# Patient Record
Sex: Female | Born: 1960 | Race: White | Hispanic: No | Smoking: Former smoker
Health system: Southern US, Community
[De-identification: ages and names within clinical notes are randomized; demographics above are authoritative.]

## PROBLEM LIST (undated history)

## (undated) DIAGNOSIS — F419 Anxiety disorder, unspecified: Secondary | ICD-10-CM

## (undated) DIAGNOSIS — F32A Depression, unspecified: Secondary | ICD-10-CM

## (undated) DIAGNOSIS — I10 Essential (primary) hypertension: Secondary | ICD-10-CM

## (undated) DIAGNOSIS — F329 Major depressive disorder, single episode, unspecified: Secondary | ICD-10-CM

## (undated) DIAGNOSIS — E785 Hyperlipidemia, unspecified: Secondary | ICD-10-CM

## (undated) DIAGNOSIS — G8929 Other chronic pain: Secondary | ICD-10-CM

## (undated) DIAGNOSIS — M5030 Other cervical disc degeneration, unspecified cervical region: Secondary | ICD-10-CM

## (undated) DIAGNOSIS — D751 Secondary polycythemia: Secondary | ICD-10-CM

## (undated) DIAGNOSIS — E0500 Thyrotoxicosis with diffuse goiter without thyrotoxic crisis or storm: Secondary | ICD-10-CM

## (undated) HISTORY — DX: Other chronic pain: G89.29

## (undated) HISTORY — DX: Secondary polycythemia: D75.1

## (undated) HISTORY — DX: Essential (primary) hypertension: I10

## (undated) HISTORY — DX: Major depressive disorder, single episode, unspecified: F32.9

## (undated) HISTORY — DX: Depression, unspecified: F32.A

## (undated) HISTORY — DX: Hyperlipidemia, unspecified: E78.5

## (undated) HISTORY — DX: Other cervical disc degeneration, unspecified cervical region: M50.30

## (undated) HISTORY — DX: Thyrotoxicosis with diffuse goiter without thyrotoxic crisis or storm: E05.00

## (undated) HISTORY — DX: Anxiety disorder, unspecified: F41.9

---

## 2010-07-12 HISTORY — PX: REDUCTION MAMMAPLASTY: SUR839

## 2011-07-13 HISTORY — PX: OTHER SURGICAL HISTORY: SHX169

## 2013-09-28 ENCOUNTER — Ambulatory Visit: Payer: Self-pay | Admitting: Gastroenterology

## 2014-03-18 ENCOUNTER — Emergency Department: Payer: Self-pay | Admitting: Emergency Medicine

## 2015-04-28 ENCOUNTER — Encounter: Payer: Self-pay | Admitting: *Deleted

## 2015-04-28 ENCOUNTER — Ambulatory Visit (INDEPENDENT_AMBULATORY_CARE_PROVIDER_SITE_OTHER): Payer: BC Managed Care – PPO | Admitting: Obstetrics and Gynecology

## 2015-04-28 VITALS — BP 151/99 | HR 99 | Resp 16 | Ht 67.0 in | Wt 206.9 lb

## 2015-04-28 DIAGNOSIS — Z8639 Personal history of other endocrine, nutritional and metabolic disease: Secondary | ICD-10-CM | POA: Insufficient documentation

## 2015-04-28 DIAGNOSIS — F329 Major depressive disorder, single episode, unspecified: Secondary | ICD-10-CM | POA: Insufficient documentation

## 2015-04-28 DIAGNOSIS — R3129 Other microscopic hematuria: Secondary | ICD-10-CM | POA: Diagnosis not present

## 2015-04-28 DIAGNOSIS — M545 Low back pain, unspecified: Secondary | ICD-10-CM | POA: Insufficient documentation

## 2015-04-28 DIAGNOSIS — F419 Anxiety disorder, unspecified: Secondary | ICD-10-CM

## 2015-04-28 DIAGNOSIS — F1120 Opioid dependence, uncomplicated: Secondary | ICD-10-CM | POA: Insufficient documentation

## 2015-04-28 DIAGNOSIS — G8929 Other chronic pain: Secondary | ICD-10-CM | POA: Insufficient documentation

## 2015-04-28 DIAGNOSIS — I10 Essential (primary) hypertension: Secondary | ICD-10-CM | POA: Insufficient documentation

## 2015-04-28 DIAGNOSIS — M5136 Other intervertebral disc degeneration, lumbar region: Secondary | ICD-10-CM | POA: Insufficient documentation

## 2015-04-28 DIAGNOSIS — F32A Depression, unspecified: Secondary | ICD-10-CM | POA: Insufficient documentation

## 2015-04-28 LAB — URINALYSIS, COMPLETE
BILIRUBIN UA: NEGATIVE
GLUCOSE, UA: NEGATIVE
KETONES UA: NEGATIVE
Leukocytes, UA: NEGATIVE
NITRITE UA: NEGATIVE
PROTEIN UA: NEGATIVE
Specific Gravity, UA: 1.02 (ref 1.005–1.030)
UUROB: 0.2 mg/dL (ref 0.2–1.0)
pH, UA: 7 (ref 5.0–7.5)

## 2015-04-28 LAB — MICROSCOPIC EXAMINATION
Renal Epithel, UA: NONE SEEN /hpf
WBC UA: NONE SEEN /HPF (ref 0–?)

## 2015-04-28 NOTE — Progress Notes (Signed)
04/28/2015 9:09 AM   Madeline Zimmerman 06/08/1961 [id number]  Referring provider: No referring provider defined for this encounter.  Chief Complaint  Patient presents with  . Hematuria  . Establish Care    HPI: Patient is a 54 year old female presenting today as a referral from her primary care provider for microscopic hematuria on 2 separate occasions. She denies any urinary symptoms including urgency, frequency, dysuria, gross hematuria or sensation of incomplete bladder emptying. No flank pain or fevers.  No history of stones. No vaginal complaints. No family history of stones or GU malignancies.  Smoked 10 years < 1ppd. Quit 20 years ago. 4-10 RBCs seen on 09/26/14 and 04/15/15 Cr 0.8 04/15/15  PMH: Past Medical History  Diagnosis Date  . Anxiety and depression   . HTN (hypertension)   . Chronic pain     With narcotic dependence  . DDD (degenerative disc disease), cervical   . Graves disease     Surgical History: Past Surgical History  Procedure Laterality Date  . Reduction mammaplasty  2012  . Revision breast reduction  2013    Home Medications:    Medication List       This list is accurate as of: 04/28/15  9:09 AM.  Always use your most recent med list.               ADVIL 200 MG tablet  Generic drug:  ibuprofen  Take 200 mg by mouth.     estradiol 0.1 MG/24HR patch  Commonly known as:  VIVELLE-DOT  Place onto the skin.     fluticasone 50 MCG/ACT nasal spray  Commonly known as:  FLONASE  Place into the nose.     LYRICA 75 MG capsule  Generic drug:  pregabalin  Take 75 mg by mouth 3 (three) times daily.     meloxicam 7.5 MG tablet  Commonly known as:  MOBIC  TAKE 1 TABLET BY MOUTH EVERY DAY     norethindrone 5 MG tablet  Commonly known as:  AYGESTIN  Take by mouth.     PARoxetine 20 MG tablet  Commonly known as:  PAXIL  TAKE 1 TABLET (20 MG TOTAL) BY MOUTH ONCE DAILY.     traMADol 50 MG tablet  Commonly known as:  ULTRAM  Take by  mouth.        Allergies:  Allergies  Allergen Reactions  . Bacitracin Itching and Rash  . Neosporin  [Neomycin-Bacitracin Zn-Polymyx] Rash    Family History: Family History  Problem Relation Age of Onset  . Heart failure Father   . Hypertension Mother     Social History:  reports that she has quit smoking. She does not have any smokeless tobacco history on file. She reports that she does not drink alcohol or use illicit drugs.  ROS: UROLOGY Frequent Urination?: No Hard to postpone urination?: No Burning/pain with urination?: No Get up at night to urinate?: No Leakage of urine?: No Urine stream starts and stops?: No Trouble starting stream?: No Do you have to strain to urinate?: No Blood in urine?: Yes Urinary tract infection?: No Sexually transmitted disease?: No Injury to kidneys or bladder?: No Painful intercourse?: No Weak stream?: No Currently pregnant?: No Vaginal bleeding?: No Last menstrual period?: n/a  Gastrointestinal Nausea?: No Vomiting?: No Indigestion/heartburn?: No Diarrhea?: No Constipation?: No  Constitutional Fever: No Night sweats?: No Weight loss?: No Fatigue?: No  Skin Skin rash/lesions?: No Itching?: No  Eyes Blurred vision?: No Double vision?: No  Ears/Nose/Throat Sore  throat?: No Sinus problems?: No  Hematologic/Lymphatic Swollen glands?: No Easy bruising?: No  Cardiovascular Leg swelling?: No Chest pain?: No  Respiratory Cough?: No Shortness of breath?: No  Endocrine Excessive thirst?: No  Musculoskeletal Back pain?: Yes Joint pain?: No  Neurological Headaches?: No Dizziness?: No  Psychologic Depression?: Yes Anxiety?: Yes  Physical Exam: BP 151/99 mmHg  Pulse 99  Resp 16  Ht  (1.702 m)  Wt 206 lb 14.4 oz (93.849 kg)  BMI 32.40 kg/m2  Constitutional:  Alert and oriented, No acute distress. HEENT: [place] AT, moist mucus membranes.  Trachea midline, no masses. Cardiovascular: No clubbing,  cyanosis, or edema. Respiratory: Normal respiratory effort, no increased work of breathing. GI: Abdomen is soft, nontender, nondistended, no abdominal masses GU: No CVA tenderness.  Skin: No rashes, bruises or suspicious lesions. Lymph: No cervical or inguinal adenopathy. Neurologic: Grossly intact, no focal deficits, moving all 4 extremities. Psychiatric: Normal mood and affect.  Laboratory Data: No results found for: WBC, HGB, HCT, MCV, PLT  No results found for: CREATININE  No results found for: PSA  No results found for: TESTOSTERONE  No results found for: HGBA1C  Urinalysis No results found for: COLORURINE, APPEARANCEUR, LABSPEC, PHURINE, GLUCOSEU, HGBUR, BILIRUBINUR, KETONESUR, PROTEINUR, UROBILINOGEN, NITRITE, LEUKOCYTESUR  Pertinent Imaging:   Assessment & Plan: 54 year old female with a remote smoking history presenting as a referral for microscopic hematuria.  1. Microscopic hematuria-  0-2 RBCs seen today.We discussed the differential diagnosis for microscopic hematuria including nephrolithiasis, renal or upper tract tumors, bladder stones, UTIs, or bladder tumors as well as undetermined etiologies.Per AUA guidelines, I did recommend complete microscopic hematuria evaluation including CTU, possible urine cytology, and office cystoscopy. He states understanding and is in agreement with plan.  There are no diagnoses linked to this encounter.  Return for CT Urogram results; cystoscopy.  Earlie Lou, FNP  [facility] [address] [place], [place] [address] [phone]

## 2015-05-05 ENCOUNTER — Other Ambulatory Visit: Payer: Self-pay | Admitting: Obstetrics and Gynecology

## 2015-05-05 DIAGNOSIS — R3129 Other microscopic hematuria: Secondary | ICD-10-CM

## 2015-05-09 ENCOUNTER — Ambulatory Visit
Admission: RE | Admit: 2015-05-09 | Discharge: 2015-05-09 | Disposition: A | Discharge status: Home / Self Care | Payer: BC Managed Care – PPO | Source: Ambulatory Visit | Attending: Obstetrics and Gynecology | Admitting: Obstetrics and Gynecology

## 2015-05-09 DIAGNOSIS — R3129 Other microscopic hematuria: Secondary | ICD-10-CM | POA: Diagnosis present

## 2015-05-09 MED ORDER — IOHEXOL 300 MG/ML  SOLN
125.0000 mL | Freq: Once | INTRAMUSCULAR | Status: AC | PRN
  Administered 2015-05-09: 125 mL via INTRAVENOUS

## 2015-05-13 ENCOUNTER — Other Ambulatory Visit: Payer: Self-pay | Admitting: Urology

## 2015-05-28 ENCOUNTER — Telehealth: Payer: Self-pay

## 2015-05-28 NOTE — Telephone Encounter (Signed)
Spoke with pt who stated her mother had a heart attack and was in the hospital. 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

## 2015-05-28 NOTE — Telephone Encounter (Signed)
-----   Message from Fernanda DrumLindsay C Overton, FNP sent at 05/26/2015 11:23 AM EST ----- We please follow up see why this patient did not return for her CT results and her cystoscopy for completion of her hematuria workup please. Thank you

## 2015-06-12 ENCOUNTER — Inpatient Hospital Stay: Payer: BC Managed Care – PPO

## 2015-06-12 ENCOUNTER — Inpatient Hospital Stay: Payer: BC Managed Care – PPO | Attending: Oncology | Admitting: Oncology

## 2015-06-12 VITALS — BP 132/93 | HR 91 | Temp 97.8°F | Resp 18 | Wt 208.1 lb

## 2015-06-12 DIAGNOSIS — Z87891 Personal history of nicotine dependence: Secondary | ICD-10-CM | POA: Diagnosis not present

## 2015-06-12 DIAGNOSIS — F1120 Opioid dependence, uncomplicated: Secondary | ICD-10-CM | POA: Diagnosis not present

## 2015-06-12 DIAGNOSIS — E0500 Thyrotoxicosis with diffuse goiter without thyrotoxic crisis or storm: Secondary | ICD-10-CM | POA: Insufficient documentation

## 2015-06-12 DIAGNOSIS — M5030 Other cervical disc degeneration, unspecified cervical region: Secondary | ICD-10-CM | POA: Insufficient documentation

## 2015-06-12 DIAGNOSIS — D751 Secondary polycythemia: Secondary | ICD-10-CM

## 2015-06-12 DIAGNOSIS — F419 Anxiety disorder, unspecified: Secondary | ICD-10-CM | POA: Insufficient documentation

## 2015-06-12 DIAGNOSIS — G8929 Other chronic pain: Secondary | ICD-10-CM | POA: Insufficient documentation

## 2015-06-12 DIAGNOSIS — I10 Essential (primary) hypertension: Secondary | ICD-10-CM | POA: Insufficient documentation

## 2015-06-12 DIAGNOSIS — F329 Major depressive disorder, single episode, unspecified: Secondary | ICD-10-CM | POA: Insufficient documentation

## 2015-06-12 DIAGNOSIS — Z79899 Other long term (current) drug therapy: Secondary | ICD-10-CM | POA: Diagnosis not present

## 2015-06-12 LAB — IRON AND TIBC
Iron: 87 ug/dL (ref 28–170)
SATURATION RATIOS: 24 % (ref 10.4–31.8)
TIBC: 368 ug/dL (ref 250–450)
UIBC: 281 ug/dL

## 2015-06-12 LAB — CBC
HCT: 49 % — ABNORMAL HIGH (ref 35.0–47.0)
HEMOGLOBIN: 16.2 g/dL — AB (ref 12.0–16.0)
MCH: 29.8 pg (ref 26.0–34.0)
MCHC: 33.1 g/dL (ref 32.0–36.0)
MCV: 90 fL (ref 80.0–100.0)
Platelets: 385 10*3/uL (ref 150–440)
RBC: 5.44 MIL/uL — AB (ref 3.80–5.20)
RDW: 13.3 % (ref 11.5–14.5)
WBC: 8.1 10*3/uL (ref 3.6–11.0)

## 2015-06-12 LAB — FERRITIN: FERRITIN: 56 ng/mL (ref 11–307)

## 2015-06-12 NOTE — Progress Notes (Signed)
Patient denies any problems today. 

## 2015-06-13 LAB — CARBON MONOXIDE, BLOOD (PERFORMED AT REF LAB): Carbon Monoxide, Blood: 2.2 % — ABNORMAL HIGH (ref 0.0–1.9)

## 2015-06-18 LAB — ERYTHROPOIETIN: ERYTHROPOIETIN: 5.1 m[IU]/mL (ref 2.6–18.5)

## 2015-06-18 LAB — JAK2 GENOTYPR

## 2015-06-18 LAB — HEMOCHROMATOSIS DNA-PCR(C282Y,H63D)

## 2015-06-20 NOTE — Progress Notes (Signed)
[facility]  [phone] [phone]) [phone]  ID: Madeline MouldAmy C Zimmerman OB: 11/23/1960  MR#: [id number]  ION#:[id number]#:[id number]  Patient Care Team: Marguarite ArbourJeffrey D Sparks, MD as PCP - General (Internal Medicine)  CHIEF COMPLAINT:  Chief Complaint  Patient presents with  . New Evaluation    polycythemia    INTERVAL HISTORY: Patient is a 54 year old female who was found to have a mildly elevated hemoglobin on routine blood work. She currently feels well and is asymptomatic. She has no neurologic complaints. She denies any recent fevers or illnesses. She has a good appetite and denies weight loss. She denies any chest pain or shortness of breath. She denies any nausea, vomiting, constipation, or diarrhea. She has no urinary complaints. Patient feels at her baseline and offers no specific complaints today.  REVIEW OF SYSTEMS:   Review of Systems  Constitutional: Negative.  Negative for malaise/fatigue.  Respiratory: Negative.   Cardiovascular: Negative.   Gastrointestinal: Negative.   Musculoskeletal: Negative.   Neurological: Negative.  Negative for weakness.    As per HPI. Otherwise, a complete review of systems is negatve.  PAST MEDICAL HISTORY: Past Medical History  Diagnosis Date  . Anxiety and depression   . HTN (hypertension)   . Chronic pain     With narcotic dependence  . DDD (degenerative disc disease), cervical   . Graves disease     PAST SURGICAL HISTORY: Past Surgical History  Procedure Laterality Date  . Reduction mammaplasty  2012  . Revision breast reduction  2013    FAMILY HISTORY Family History  Problem Relation Age of Onset  . Heart failure Father   . Hypertension Mother        ADVANCED DIRECTIVES:    HEALTH MAINTENANCE: Social History  Substance Use Topics  . Smoking status: Former Games developermoker  . Smokeless tobacco: Not on file  . Alcohol Use: No     Colonoscopy:  PAP:  Bone density:  Lipid panel:  Allergies  Allergen Reactions    . Bacitracin Itching and Rash  . Neosporin  [Neomycin-Bacitracin Zn-Polymyx] Rash    Current Outpatient Prescriptions  Medication Sig Dispense Refill  . estradiol (VIVELLE-DOT) 0.1 MG/24HR patch Place onto the skin.    . fluticasone (FLONASE) 50 MCG/ACT nasal spray Place into the nose.    . ibuprofen (ADVIL) 200 MG tablet Take 200 mg by mouth.    Marland Kitchen. LORazepam (ATIVAN) 0.5 MG tablet TAKE 1 TABLET BY MOUTH 3 TIMES DAILY AS NEEDED FOR ANXIETY    . LYRICA 75 MG capsule Take 75 mg by mouth 3 (three) times daily.  5  . meloxicam (MOBIC) 7.5 MG tablet TAKE 1 TABLET BY MOUTH EVERY DAY    . norethindrone (AYGESTIN) 5 MG tablet Take by mouth.    Marland Kitchen. PARoxetine (PAXIL) 20 MG tablet TAKE 1 TABLET (20 MG TOTAL) BY MOUTH ONCE DAILY.    . traMADol (ULTRAM) 50 MG tablet Take by mouth.     No current facility-administered medications for this visit.    OBJECTIVE: Filed Vitals:   06/12/15 1021  BP: 132/93  Pulse: 91  Temp: 97.8 F (36.6 C)  Resp: 18     Body mass index is 32.59 kg/(m^2).    ECOG FS:0 - Asymptomatic  General: Well-developed, well-nourished, no acute distress. Eyes: Pink conjunctiva, anicteric sclera. HEENT: Normocephalic, moist mucous membranes, clear oropharnyx. Lungs: Clear to auscultation bilaterally. Heart: Regular rate and rhythm. No rubs, murmurs, or gallops. Abdomen: Soft, nontender, nondistended. No organomegaly noted, normoactive bowel sounds. Musculoskeletal:  No edema, cyanosis, or clubbing. Neuro: Alert, answering all questions appropriately. Cranial nerves grossly intact. Skin: No rashes or petechiae noted. Psych: Normal affect. Lymphatics: No cervical, calvicular, axillary or inguinal LAD.   LAB RESULTS:  No results found for: NA, K, CL, CO2, GLUCOSE, BUN, CREATININE, CALCIUM, PROT, ALBUMIN, AST, ALT, ALKPHOS, BILITOT, GFRNONAA, GFRAA  Lab Results  Component Value Date   WBC 8.1 06/12/2015   HGB 16.2* 06/12/2015   HCT 49.0* 06/12/2015   MCV 90.0 06/12/2015    PLT 385 06/12/2015     STUDIES: No results found.  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  PLAN:    1. Polycythemia: Patient's hemoglobin is only mildly elevated at 16.2. The remainder of her laboratory work including iron stores, erythropoietin, and JAK-2 mutation are either negative or within normal limits. She has a mildly elevated carboxyhemoglobin level that is likely clinically insignificant. No intervention is needed at this time. Patient does not require phlebotomy. After lengthy discussion with the patient, she has requested no further follow-up. Please refer her back if her hemoglobin trends up and approaches 18.0. 2. Heterozygote for hemachromatosis: Patient is an asymptomatic carrier. Iron stores, including ferritin, are within normal limits. No intervention is needed.  Patient expressed understanding and was in agreement with this plan. She also understands that She can call clinic at any time with any questions, concerns, or complaints.    Jeralyn Ruths, MD   06/20/2015 5:32 PM

## 2016-03-04 ENCOUNTER — Other Ambulatory Visit: Payer: Self-pay | Admitting: Internal Medicine

## 2016-03-04 DIAGNOSIS — M5136 Other intervertebral disc degeneration, lumbar region: Secondary | ICD-10-CM

## 2016-03-23 ENCOUNTER — Ambulatory Visit
Admission: RE | Admit: 2016-03-23 | Discharge: 2016-03-23 | Disposition: A | Discharge status: Home / Self Care | Payer: BC Managed Care – PPO | Source: Ambulatory Visit | Attending: Internal Medicine | Admitting: Internal Medicine

## 2016-03-23 DIAGNOSIS — M2578 Osteophyte, vertebrae: Secondary | ICD-10-CM | POA: Diagnosis not present

## 2016-03-23 DIAGNOSIS — M5136 Other intervertebral disc degeneration, lumbar region: Secondary | ICD-10-CM | POA: Insufficient documentation

## 2016-03-23 DIAGNOSIS — M5126 Other intervertebral disc displacement, lumbar region: Secondary | ICD-10-CM | POA: Diagnosis not present

## 2016-03-23 DIAGNOSIS — M4806 Spinal stenosis, lumbar region: Secondary | ICD-10-CM | POA: Insufficient documentation

## 2017-05-05 ENCOUNTER — Ambulatory Visit
Payer: BC Managed Care – PPO | Attending: Student in an Organized Health Care Education/Training Program | Admitting: Student in an Organized Health Care Education/Training Program

## 2017-05-05 ENCOUNTER — Encounter: Payer: Self-pay | Admitting: Student in an Organized Health Care Education/Training Program

## 2017-05-05 VITALS — BP 140/97 | HR 66 | Temp 98.2°F | Resp 16 | Ht 68.0 in | Wt 205.0 lb

## 2017-05-05 DIAGNOSIS — Z791 Long term (current) use of non-steroidal anti-inflammatories (NSAID): Secondary | ICD-10-CM | POA: Insufficient documentation

## 2017-05-05 DIAGNOSIS — M47816 Spondylosis without myelopathy or radiculopathy, lumbar region: Secondary | ICD-10-CM | POA: Diagnosis not present

## 2017-05-05 DIAGNOSIS — G894 Chronic pain syndrome: Secondary | ICD-10-CM

## 2017-05-05 DIAGNOSIS — E0500 Thyrotoxicosis with diffuse goiter without thyrotoxic crisis or storm: Secondary | ICD-10-CM | POA: Diagnosis not present

## 2017-05-05 DIAGNOSIS — F329 Major depressive disorder, single episode, unspecified: Secondary | ICD-10-CM

## 2017-05-05 DIAGNOSIS — Z79891 Long term (current) use of opiate analgesic: Secondary | ICD-10-CM | POA: Insufficient documentation

## 2017-05-05 DIAGNOSIS — M9983 Other biomechanical lesions of lumbar region: Secondary | ICD-10-CM | POA: Diagnosis not present

## 2017-05-05 DIAGNOSIS — Z881 Allergy status to other antibiotic agents status: Secondary | ICD-10-CM | POA: Diagnosis not present

## 2017-05-05 DIAGNOSIS — I129 Hypertensive chronic kidney disease with stage 1 through stage 4 chronic kidney disease, or unspecified chronic kidney disease: Secondary | ICD-10-CM | POA: Insufficient documentation

## 2017-05-05 DIAGNOSIS — F419 Anxiety disorder, unspecified: Secondary | ICD-10-CM

## 2017-05-05 DIAGNOSIS — N189 Chronic kidney disease, unspecified: Secondary | ICD-10-CM | POA: Diagnosis not present

## 2017-05-05 DIAGNOSIS — Z79899 Other long term (current) drug therapy: Secondary | ICD-10-CM | POA: Insufficient documentation

## 2017-05-05 DIAGNOSIS — M48061 Spinal stenosis, lumbar region without neurogenic claudication: Secondary | ICD-10-CM

## 2017-05-05 DIAGNOSIS — Z888 Allergy status to other drugs, medicaments and biological substances status: Secondary | ICD-10-CM | POA: Insufficient documentation

## 2017-05-05 NOTE — Patient Instructions (Addendum)
Today we discussed the following:  1. Please wean yourself off of Ativan. You will not be considered for opioid therapy at this clinic if you are on benzodiazepines.  2. Referral to pain psychology regarding risk of chronic opioid therapy.  3. Lumbar epidural steroid injection in January after patient's insurance resets. Can also consider lumbar facets for lumbar spondylosis.   4. Follow-up in 2 months or earlier once you have weaned herself off Ativan and once you have seen the pain psychologist.  Epidural Steroid Injection Patient Information  Description: The epidural space surrounds the nerves as they exit the spinal cord.  In some patients, the nerves can be compressed and inflamed by a bulging disc or a tight spinal canal (spinal stenosis).  By injecting steroids into the epidural space, we can bring irritated nerves into direct contact with a potentially helpful medication.  These steroids act directly on the irritated nerves and can reduce swelling and inflammation which often leads to decreased pain.  Epidural steroids may be injected anywhere along the spine and from the neck to the low back depending upon the location of your pain.   After numbing the skin with local anesthetic (like Novocaine), a small needle is passed into the epidural space slowly.  You may experience a sensation of pressure while this is being done.  The entire block usually last less than 10 minutes.  Conditions which may be treated by epidural steroids:   Low back and leg pain  Neck and arm pain  Spinal stenosis  Post-laminectomy syndrome  Herpes zoster (shingles) pain  Pain from compression fractures  Preparation for the injection:  1. Do not eat any solid food or dairy products within 3 hours of your appointment.  2. You may drink clear liquids up to 3 hours before appointment.  Clear liquids include water, black coffee, juice or soda.  No milk or cream please. 3. You may take your regular  medication, including pain medications, with a sip of water before your appointment  Diabetics should hold regular insulin (if taken separately) and take 1/2 normal NPH dos the morning of the procedure.  Carry some sugar containing items with you to your appointment. 4. Bring all your current medications with your. 5. An IV may be inserted and sedation may be given at the discretion of the physician.   6. A blood pressure cuff, EKG and other monitors will often be applied during the procedure.  Some patients may need to have extra oxygen administered for a short period. 7. You will be asked to provide medical information, including your allergies, prior to the procedure.  We must know immediately if you are taking blood thinners (like Coumadin/Warfarin)  Or if you are allergic to IV iodine contrast (dye). We must know if you could possible be pregnant.  Possible side-effects:  Bleeding from needle site  Infection (rare, may require surgery)  Nerve injury (rare)  Numbness & tingling (temporary)  Difficulty urinating (rare, temporary)  Spinal headache ( a headache worse with upright posture)  Light -headedness (temporary)  Pain at injection site (several days)  Decreased blood pressure (temporary)  Weakness in arm/leg (temporary)  Pressure sensation in back/neck (temporary)  Call if you experience:  Fever/chills associated with headache or increased back/neck pain.  Headache worsened by an upright position.  New onset weakness or numbness of an extremity below the injection site  Hives or difficulty breathing (go to the emergency room)  Inflammation or drainage at the infection site  Severe back/neck pain  Any new symptoms which are concerning to you  Please note:  Although the local anesthetic injected can often make your back or neck feel good for several hours after the injection, the pain will likely return.  It takes 3-7 days for steroids to work in the epidural  space.  You may not notice any pain relief for at least that one week.  If effective, we will often do a series of three injections spaced 3-6 weeks apart to maximally decrease your pain.  After the initial series, we generally will wait several months before considering a repeat injection of the same type.  If you have any questions, please call [phone] [facility]

## 2017-05-05 NOTE — Progress Notes (Signed)
Safety precautions to be maintained throughout the outpatient stay will include: orient to surroundings, keep bed in low position, maintain call bell within reach at all times, provide assistance with transfer out of bed and ambulation.  

## 2017-05-05 NOTE — Progress Notes (Signed)
Patient's Name: DARCIE MELLONE  MRN: [id number]  Referring Provider: Idelle Crouch, MD  DOB: 05-Nov-1960  PCP: Idelle Crouch, MD  DOS: 05/05/2017  Note by: Gillis Santa, MD  Service setting: Ambulatory outpatient  Specialty: Interventional Pain Management  Location: [facility] (AMB) Pain Management Facility  Visit type: Initial Patient Evaluation  Patient type: New Patient   Primary Reason(s) for Visit: Encounter for initial evaluation of one or more chronic problems (new to examiner) potentially causing chronic pain, and posing a threat to normal musculoskeletal function. (Level of risk: High) CC: Back Pain (lower)  HPI  Ms. Lapage is a 56 y.o. year old, female patient, who comes today to see [place] for the first time for an initial evaluation of her chronic pain. She has Anxiety and depression; Benign hypertension; Chronic pain; Degeneration of intervertebral disc of lumbar region; Personal history of other endocrine, nutritional and metabolic disease; and Opioid dependence ([place]) on her problem list. Today she comes in for evaluation of her Back Pain (lower)  Pain Assessment: Location: Lower Back Radiating: left buttock and entire left leg Onset: More than a month ago Duration: Chronic pain Quality: Sharp, Aching, Stabbing, Dull Severity: 3 /10 (self-reported pain score)  Note: Reported level is compatible with observation.                   When using our objective Pain Scale, levels between 6 and 10/10 are said to belong in an emergency room, as it progressively worsens from a 6/10, described as severely limiting, requiring emergency care not usually available at an outpatient pain management facility. At a 6/10 level, communication becomes difficult and requires great effort. Assistance to reach the emergency department may be required. Facial flushing and profuse sweating along with potentially dangerous increases in heart rate and blood pressure will be evident. Effect on ADL: limits  housework Timing: Intermittent Modifying factors: ice, heat, lying down  Onset and Duration: Gradual and Present longer than 3 months Cause of pain: "chiropractor said it started when I was pregnant" Severity: Getting worse, NAS-11 at its worse: 10/10, NAS-11 at its best: 0/10, NAS-11 now: 2/10 and NAS-11 on the average: 4/10 Timing: During activity or exercise Aggravating Factors: Bending, Lifiting, Prolonged sitting, Prolonged standing, Stooping , Twisting and Walking Alleviating Factors: Lying down, Medications, Resting and Chiropractic manipulations Associated Problems: Fatigue and Pain that wakes patient up Quality of Pain: Aching, Annoying, Burning, Deep, Disabling, Sharp, Shooting, Stabbing and Tiring Previous Examinations or Tests: CT scan, MRI scan and Chiropractic evaluation Previous Treatments: Narcotic medications and Pool exercises  The patient comes into the clinics today for the first time for a chronic pain management evaluation.   56 year old female who presents with axial low back pain left greater than right with radiation into her left leg. Pain has been going on for many years. Patient states that it started after childbirth. She has tried various medications including Lyrica, gabapentin and she did not find effective. She has tried various muscle relaxants including Flexeril and did not like how they made her feel. She has also tried various gels and topical therapies including Voltaren and lidocaine ointment which were not effective. Patient states that she does do morning stretching exercises to help with her pain. Patient also has depression for which she is on Paxil 20 mg.  Of note patient's chronic pain was previously managed by Dr. Doy Hutching, PCP. However in May 2018, patient's urine drug screen was positive for marijuana in her opioid medications were weaned  and discontinued. Prior to this patient was on concomitant opioid and benzodiazepine therapy with Ativan 0.5 mg 3  times a day when necessary. She states that her pain and quality of life are worse now without her being on opioid medications.  No red flags. No bowel or bladder dysfunction. No lower extremities weakness.  Today I took the time to provide the patient with information regarding my pain practice. The patient was informed that my practice is divided into two sections: an interventional pain management section, as well as a completely separate and distinct medication management section. I explained that I have procedure days for my interventional therapies, and evaluation days for follow-ups and medication management. Because of the amount of documentation required during both, they are kept separated. This means that there is the possibility that she may be scheduled for a procedure on one day, and medication management the next. I have also informed her that because of staffing and facility limitations, I no longer take patients for medication management only. To illustrate the reasons for this, I gave the patient the example of surgeons, and how inappropriate it would be to refer a patient to his/her care, just to write for the post-surgical antibiotics on a surgery done by a different surgeon.   Because interventional pain management is my board-certified specialty, the patient was informed that joining my practice means that they are open to any and all interventional therapies. I made it clear that this does not mean that they will be forced to have any procedures done. What this means is that I believe interventional therapies to be essential part of the diagnosis and proper management of chronic pain conditions. Therefore, patients not interested in these interventional alternatives will be better served under the care of a different practitioner.  The patient was also made aware of my Comprehensive Pain Management Safety Guidelines where by joining my practice, they limit all of their nerve blocks and  joint injections to those done by our practice, for as long as we are retained to manage their care.   Historic Controlled Substance Pharmacotherapy Review  PMP and historical list of controlled substances: Previously on hydrocodone 10 mg 3 times a day to 4 times a day, quantity 120 month. MME/day: 40 mg/day Medications: The patient did not bring the medication(s) to the appointment, as requested in our "New Patient Package" Pharmacodynamics: Desired effects: Analgesia: The patient reports >50% benefit. Reported improvement in function: The patient reports medication allows her to accomplish basic ADLs. Clinically meaningful improvement in function (CMIF): Sustained CMIF goals met Perceived effectiveness: Described as relatively effective, allowing for increase in activities of daily living (ADL) Undesirable effects: Side-effects or Adverse reactions: None reported Historical Monitoring: The patient  reports that she does not use drugs. List of all UDS Test(s): No results found for: MDMA, COCAINSCRNUR, PCPSCRNUR, PCPQUANT, CANNABQUANT, THCU, Ivor List of all Serum Drug Screening Test(s):  No results found for: AMPHSCRSER, BARBSCRSER, BENZOSCRSER, COCAINSCRSER, PCPSCRSER, PCPQUANT, THCSCRSER, CANNABQUANT, OPIATESCRSER, OXYSCRSER, PROPOXSCRSER Historical Background Evaluation: [place] PMP: Six (6) year initial data search conducted.             PMP NARX Score Report:  Narcotic: 370 Sedative: 390 Stimulant: 0 [place] Department of public safety, offender search: Editor, commissioning Information) Non-contributory Risk Assessment Profile: Aberrant behavior: None observed or detected today Risk factors for fatal opioid overdose: None identified today PMP NARX Overdose Risk Score: 130 Fatal overdose hazard ratio (HR): Calculation deferred Non-fatal overdose hazard ratio (HR): Calculation deferred Risk of  opioid abuse or dependence: 0.7-3.0% with doses ? 36 MME/day and 6.1-26% with doses ? 120 MME/day. Substance  use disorder (SUD) risk level: Pending results of Medical Psychology Evaluation for SUD Opioid risk tool (ORT) (Total Score): 0     Opioid Risk Tool - 05/05/17 0817      Family History of Substance Abuse   Alcohol Negative   Illegal Drugs Negative   Rx Drugs Negative     Personal History of Substance Abuse   Alcohol Negative   Illegal Drugs Negative   Rx Drugs Negative     Age   Age between 73-45 years  No     History of Preadolescent Sexual Abuse   History of Preadolescent Sexual Abuse Negative or Female     Psychological Disease   Psychological Disease Negative   Depression Negative     Total Score   Opioid Risk Tool Scoring 0   Opioid Risk Interpretation Low Risk     ORT Scoring interpretation table:  Score <3 = Low Risk for SUD  Score between 4-7 = Moderate Risk for SUD  Score >8 = High Risk for Opioid Abuse     Pharmacologic Plan: Pending ordered tests and/or consults            Initial impression: Pending review of available data and ordered tests.  Meds   Current Outpatient Prescriptions:  .  bisoprolol-hydrochlorothiazide (ZIAC) 5-6.25 MG tablet, Take 1 tablet by mouth daily., Disp: , Rfl:  .  fluticasone (FLONASE) 50 MCG/ACT nasal spray, Place into the nose., Disp: , Rfl:  .  ibuprofen (ADVIL) 200 MG tablet, Take 200 mg by mouth., Disp: , Rfl:  .  LORazepam (ATIVAN) 0.5 MG tablet, TAKE 1 TABLET BY MOUTH 3 TIMES DAILY AS NEEDED FOR ANXIETY, Disp: , Rfl:  .  LYRICA 75 MG capsule, Take 75 mg by mouth 3 (three) times daily., Disp: , Rfl: 5 .  PARoxetine (PAXIL) 20 MG tablet, TAKE 1 TABLET (20 MG TOTAL) BY MOUTH ONCE DAILY., Disp: , Rfl:  .  estradiol (VIVELLE-DOT) 0.1 MG/24HR patch, Place onto the skin., Disp: , Rfl:  .  meloxicam (MOBIC) 7.5 MG tablet, TAKE 1 TABLET BY MOUTH EVERY DAY, Disp: , Rfl:  .  norethindrone (AYGESTIN) 5 MG tablet, Take by mouth., Disp: , Rfl:  .  traMADol (ULTRAM) 50 MG tablet, Take by mouth., Disp: , Rfl:   Imaging Review    Lumbosacral Imaging: Lumbar MR wo contrast:  Results for orders placed during the hospital encounter of 03/23/16  MR Lumbar Spine Wo Contrast   Narrative CLINICAL DATA:  56 year old female with low back pain for 25 years worse in the past 3 months. Radiates to left leg and both hips. Initial encounter.  EXAM: MRI LUMBAR SPINE WITHOUT CONTRAST  TECHNIQUE: Multiplanar, multisequence MR imaging of the lumbar spine was performed. No intravenous contrast was administered.  COMPARISON:  05/09/2015 CT abdomen pelvis.  FINDINGS: Segmentation: Last fully open disk space is labeled L5-S1. Present examination incorporates from T11-12 disc space through the lower S2 level.  Alignment:  Curvature of the lumbar spine convex left.  Vertebrae: No worrisome osseous abnormality. Discogenic changes surround the L5-S1 disc space.  Conus medullaris: Extends to the L1 level and appears normal.  Paraspinal and other soft tissues: No worrisome abnormality.  Disc levels:  T11-12: Minimal Schmorl's node deformity inferior endplate T11.  F62-Z3:  Minimal Schmorl's node deformity inferior endplate T12.  Y8-6: Minimal bulge greater to the right. Minimal facet degenerative changes.  L2-3: Mild to slightly moderate facet degenerative changes. Minimal bulge greater to the right. Minimal narrowing lateral aspect of the thecal sac greater on the right.  L3-4: Prominent facet degenerative changes greater on the right. Ligamentum flavum hypertrophy. Minimal anterior slip L3. Bulge. Slightly short pedicles. Dorsal epidural fat. Multifactorial mild thecal sac narrowing. Minimal right foraminal narrowing.  L4-5: Prominent disc degeneration with disc space narrowing. Bulge and osteophyte with greater extension right foraminal/ lateral position without compression of the exiting right L4 nerve root. Moderate facet degenerative changes greater on right. Ligamentum flavum hypertrophy. Mild narrowing  lateral aspect of the thecal sac greater on right with minimal crowding of the contained right L5 nerve root.  L5-S1: Moderate facet degenerative changes. Disc degeneration with endplate reactive changes. Bulge and osteophyte with foraminal/ lateral extension greater on the left. Crowding of the course the exiting L5 nerve root greater on the left. Mild lateral recess narrowing greater on left.  IMPRESSION: L5-S1 bulge and osteophyte with foraminal/ lateral extension greater on the left. Crowding of the course the exiting L5 nerve root greater on the left. Mild lateral recess narrowing greater on left.  L4-5 bulge and osteophyte with greater extension right foraminal/ lateral position without compression of the exiting right L4 nerve root. Multifactorial mild narrowing lateral aspect of the thecal sac greater on right with minimal crowding of the contained right L5 nerve root.  L3-4 multifactorial mild thecal sac narrowing. Minimal right foraminal narrowing.  L2-3 minimal narrowing lateral aspect of the thecal sac greater on the right.  Facet degenerative changes lower lumbar spine as detailed above.   Electronically Signed   By: Genia Del M.D.   On: 03/23/2016 11:43      Complexity Note: Imaging results reviewed. Results shared with Ms. Henrene Pastor, using Layman's terms.                         ROS  Cardiovascular History: High blood pressure Pulmonary or Respiratory History: No reported pulmonary signs or symptoms such as wheezing and difficulty taking a deep full breath (Asthma), difficulty blowing air out (Emphysema), coughing up mucus (Bronchitis), persistent dry cough, or temporary stoppage of breathing during sleep Neurological History: No reported neurological signs or symptoms such as seizures, abnormal skin sensations, urinary and/or fecal incontinence, being born with an abnormal open spine and/or a tethered spinal cord Review of Past Neurological Studies: No  results found for this or any previous visit. Psychological-Psychiatric History: No reported psychological or psychiatric signs or symptoms such as difficulty sleeping, anxiety, depression, delusions or hallucinations (schizophrenial), mood swings (bipolar disorders) or suicidal ideations or attempts Gastrointestinal History: No reported gastrointestinal signs or symptoms such as vomiting or evacuating blood, reflux, heartburn, alternating episodes of diarrhea and constipation, inflamed or scarred liver, or pancreas or irrregular and/or infrequent bowel movements Genitourinary History: No reported renal or genitourinary signs or symptoms such as difficulty voiding or producing urine, peeing blood, non-functioning kidney, kidney stones, difficulty emptying the bladder, difficulty controlling the flow of urine, or chronic kidney disease Hematological History: No reported hematological signs or symptoms such as prolonged bleeding, low or poor functioning platelets, bruising or bleeding easily, hereditary bleeding problems, low energy levels due to low hemoglobin or being anemic Endocrine History: High thyroid Rheumatologic History: No reported rheumatological signs and symptoms such as fatigue, joint pain, tenderness, swelling, redness, heat, stiffness, decreased range of motion, with or without associated rash Musculoskeletal History: Negative for myasthenia gravis, muscular dystrophy, multiple sclerosis or  malignant hyperthermia Work History: Retired  Allergies  Ms. Blackley is allergic to bacitracin and neosporin  [neomycin-bacitracin zn-polymyx].  Laboratory Chemistry  Inflammation Markers (CRP: Acute Phase) (ESR: Chronic Phase) No results found for: CRP, ESRSEDRATE               Renal Function Markers No results found for: BUN, CREATININE, GFRAA, GFRNONAA               Hepatic Function Markers No results found for: AST, ALT, ALBUMIN, ALKPHOS, HCVAB               Electrolytes No results found  for: NA, K, CL, CALCIUM, MG               Neuropathy Markers No results found for: [id number]               Bone Pathology Markers No results found for: Hendricks Milo, VD125OH2TOT, [id number], [id number], 25OHVITD1, 25OHVITD2, 25OHVITD3, CALCIUM, TESTOFREE, TESTOSTERONE               Coagulation Parameters Lab Results  Component Value Date   PLT 385 06/12/2015                 Cardiovascular Markers Lab Results  Component Value Date   HGB 16.2 (H) 06/12/2015   HCT 49.0 (H) 06/12/2015                 Note: Lab results reviewed.  [place]  Drug: Ms. Schader  reports that she does not use drugs. Alcohol:  reports that she does not drink alcohol. Tobacco:  reports that she has quit smoking. She has never used smokeless tobacco. Medical:  has a past medical history of Anxiety and depression; Chronic pain; DDD (degenerative disc disease), cervical; Graves disease; and HTN (hypertension). Family: family history includes Heart failure in her father; Hypertension in her mother.  Past Surgical History:  Procedure Laterality Date  . REDUCTION MAMMAPLASTY  2012  . revision breast reduction  2013   Active Ambulatory Problems    Diagnosis Date Noted  . Anxiety and depression 04/28/2015  . Benign hypertension 04/28/2015  . Chronic pain 04/28/2015  . Degeneration of intervertebral disc of lumbar region 04/28/2015  . Personal history of other endocrine, nutritional and metabolic disease 02/54/2706  . Opioid dependence ([place]) 04/28/2015   Resolved Ambulatory Problems    Diagnosis Date Noted  . No Resolved Ambulatory Problems   Past Medical History:  Diagnosis Date  . Anxiety and depression   . Chronic pain   . DDD (degenerative disc disease), cervical   . Graves disease   . HTN (hypertension)    Constitutional Exam  General appearance: Well nourished, well developed, and well hydrated. In no apparent acute distress Vitals:   05/05/17 0810  BP: (!) 140/97  Pulse: 66  Resp: 16   Temp: 98.2 F (36.8 C)  TempSrc: Oral  SpO2: 100%  Weight: 205 lb (93 kg)  Height: '5\' 8"'$  (1.727 m)   BMI Assessment: Estimated body mass index is 31.17 kg/m as calculated from the following:   Height as of this encounter: '5\' 8"'$  (1.727 m).   Weight as of this encounter: 205 lb (93 kg).  BMI interpretation table: BMI level Category Range association with higher incidence of chronic pain  <18 kg/m2 Underweight   18.5-24.9 kg/m2 Ideal body weight   25-29.9 kg/m2 Overweight Increased incidence by 20%  30-34.9 kg/m2 Obese (Class I) Increased incidence by 68%  35-39.9 kg/m2 Severe obesity (Class  II) Increased incidence by 136%  >40 kg/m2 Extreme obesity (Class III) Increased incidence by 254%   BMI Readings from Last 4 Encounters:  05/05/17 31.17 kg/m  06/12/15 32.60 kg/m  04/28/15 32.41 kg/m   Wt Readings from Last 4 Encounters:  05/05/17 205 lb (93 kg)  06/12/15 208 lb 1.8 oz (94.4 kg)  04/28/15 206 lb 14.4 oz (93.8 kg)  Psych/Mental status: Alert, oriented x 3 (person, place, & time)       Eyes: PERLA Respiratory: No evidence of acute respiratory distress  Cervical Spine Area Exam  Skin & Axial Inspection: No masses, redness, edema, swelling, or associated skin lesions Alignment: Symmetrical Functional ROM: Unrestricted ROM      Stability: No instability detected Muscle Tone/Strength: Functionally intact. No obvious neuro-muscular anomalies detected. Sensory (Neurological): Unimpaired Palpation: No palpable anomalies              Upper Extremity (UE) Exam    Side: Right upper extremity  Side: Left upper extremity  Skin & Extremity Inspection: Skin color, temperature, and hair growth are WNL. No peripheral edema or cyanosis. No masses, redness, swelling, asymmetry, or associated skin lesions. No contractures.  Skin & Extremity Inspection: Skin color, temperature, and hair growth are WNL. No peripheral edema or cyanosis. No masses, redness, swelling, asymmetry, or  associated skin lesions. No contractures.  Functional ROM: Unrestricted ROM          Functional ROM: Unrestricted ROM          Muscle Tone/Strength: Functionally intact. No obvious neuro-muscular anomalies detected.  Muscle Tone/Strength: Functionally intact. No obvious neuro-muscular anomalies detected.  Sensory (Neurological): Unimpaired          Sensory (Neurological): Unimpaired          Palpation: No palpable anomalies              Palpation: No palpable anomalies              Specialized Test(s): Deferred         Specialized Test(s): Deferred          Thoracic Spine Area Exam  Skin & Axial Inspection: No masses, redness, or swelling Alignment: Symmetrical Functional ROM: Unrestricted ROM Stability: No instability detected Muscle Tone/Strength: Functionally intact. No obvious neuro-muscular anomalies detected. Sensory (Neurological): Unimpaired Muscle strength & Tone: No palpable anomalies  Lumbar Spine Area Exam  Skin & Axial Inspection: No masses, redness, or swelling Alignment: Symmetrical Functional ROM: Unrestricted ROM      Stability: No instability detected Muscle Tone/Strength: Functionally intact. No obvious neuro-muscular anomalies detected. Sensory (Neurological): Articular pain pattern pain with lumbar extension. Palpation: Complains of area being tender to palpation Bilateral Fist Percussion Test Provocative Tests: Lumbar Hyperextension and rotation test: Positive bilaterally for facet joint pain. Left greater than right Lumbar Lateral bending test: Positive ipsilateral radicular pain, on the left. Positive for left-sided foraminal stenosis. Patrick's Maneuver: evaluation deferred today                    Gait & Posture Assessment  Ambulation: Unassisted Gait: Relatively normal for age and body habitus Posture: WNL   Lower Extremity Exam    Side: Right lower extremity  Side: Left lower extremity  Skin & Extremity Inspection: Skin color, temperature, and hair  growth are WNL. No peripheral edema or cyanosis. No masses, redness, swelling, asymmetry, or associated skin lesions. No contractures.  Skin & Extremity Inspection: Skin color, temperature, and hair growth are WNL. No peripheral edema or cyanosis.  No masses, redness, swelling, asymmetry, or associated skin lesions. No contractures.  Functional ROM: Unrestricted ROM          Functional ROM: Unrestricted ROM          Muscle Tone/Strength: Functionally intact. No obvious neuro-muscular anomalies detected.  Muscle Tone/Strength: Functionally intact. No obvious neuro-muscular anomalies detected.  Sensory (Neurological): Unimpaired  Sensory (Neurological): Unimpaired  Palpation: No palpable anomalies  Palpation: No palpable anomalies   Assessment  Primary Diagnosis & Pertinent Problem List: The primary encounter diagnosis was Lumbar spondylosis. Diagnoses of Lumbar facet arthropathy, Lumbar foraminal stenosis, Spinal stenosis of lumbar region without neurogenic claudication, Anxiety and depression, and Chronic pain syndrome were also pertinent to this visit.  Visit Diagnosis (New problems to examiner): 1. Lumbar spondylosis   2. Lumbar facet arthropathy   3. Lumbar foraminal stenosis   4. Spinal stenosis of lumbar region without neurogenic claudication   5. Anxiety and depression   6. Chronic pain syndrome    Plan of Care (Initial workup plan)  Note: Please be advised that as per protocol, today's visit has been an evaluation only. We have not taken over the patient's controlled substance management.  General Recommendations: The pain condition that the patient suffers from is best treated with a multidisciplinary approach that involves an increase in physical activity to prevent de-conditioning and worsening of the pain cycle, as well as psychological counseling (formal and/or informal) to address the co-morbid psychological affects of pain. Treatment will often involve judicious use of pain  medications and interventional procedures to decrease the pain, allowing the patient to participate in the physical activity that will ultimately produce long-lasting pain reductions. The goal of the multidisciplinary approach is to return the patient to a higher level of overall function and to restore their ability to perform activities of daily living.  56 year old female with a history of axial low back pain left greater than right with radiation into the left leg. Back pain is chronic in nature and could be secondary to L5-S1 disc bulge causing foraminal and lateral recess compression specifically involving the L5 nerve root on the left. Patient also has multilevel lumbar degenerative disc disease and facet arthropathy, spondylosis that could be explaining her localized axial back pain which is different in quality and nature that her sharp shooting pain, intermittent, and her left leg. We discussed lumbar epidural steroid injections as well as lumbar diagnostic facet blocks with goal radiofrequency ablation. Patient states that she is interested however would like to wait until January when her insurance pricing resets.  In regards to medication management, the patient is interested in restarting opioid therapy. I told her that she needs to be weaned off benzodiazepines before this is even a consideration. I do not prescribe opioid medications to anyone that is on benzodiazepine therapy given the increased risk of respiratory depression. Furthermore the patient states that she is not using marijuana any longer. Patient will need a evaluation from pain psychology regarding her risk of chronic opioid therapy. Patient has been on many medications in the past which are included below which were not effective including gabapentin, Lyrica, various muscle relaxants, NSAIDs.  I will also send the patient for physical therapy for her lumbar paraspinal muscle strengthening and range of motion exercises that she  can employed.  Plan: -Urine drug screen at next visit once the patient has weaned herself off Ativan. I expect this UDS is to be negative for benzodiazepines, opioids, any illicit substances. -Referral to pain psychology which is  standard for all new patients regarding risk of chronic opioid therapy -Left L5-S1 ESI after January.  Orders Placed This Encounter  Procedures  . Ambulatory referral to Psychology    Referral Priority:   Routine    Referral Type:   Psychiatric    Referral Reason:   Specialty Services Required    Requested Specialty:   Psychology    Number of Visits Requested:   1  . Ambulatory referral to Physical Therapy    Referral Priority:   Routine    Referral Type:   Physical Medicine    Referral Reason:   Specialty Services Required    Requested Specialty:   Physical Therapy    Number of Visits Requested:   1    Pharmacological management options:  Opioid Analgesics: The patient was informed that there is no guarantee that she would be a candidate for opioid analgesics. The decision will be made following CDC guidelines. This decision will be based on the results of diagnostic studies, as well as Ms. Waguespack's risk profile. Consider Nucynta, Butrans, hydrocodone.   Membrane stabilizer: Tried and failed including gabapentin, Lyrica. Can consider Topamax in the future.   Muscle relaxant: Tried and failed or resulted in side effects: Flexeril. May consider baclofen tizanidine patient introduced in the future.   NSAID: Has tried Mobic. Can consider by mouth diclofenac  Other analgesic(s): To be determined at a later time   Interventional management options: Ms. Marshman was informed that there is no guarantee that she would be a candidate for interventional therapies. The decision will be based on the results of diagnostic studies, as well as Ms. Winrow's risk profile.  Procedure(s) under consideration:  -Left L5-S1 epidural steroid injection (after January once insurance  resets ) -Bilateral lumbar diagnostic facet blocks with goal radio frequency ablation -Left sacroiliac joint injection    Provider-requested follow-up: Return in about 8 weeks (around 06/30/2017) for After Psychological evaluation. Time Note: Greater than 50% of the 60 minute(s) of face-to-face time spent with Ms. Keddy, was spent in counseling/coordination of care regarding: Ms. Kitchen primary cause of pain, the treatment plan and the goals of pain management (increased in functionality).  No future appointments.  Primary Care Physician: Marguarite Arbour, MD Location: [facility] Note by: Edward Jolly, M.D, Date: 05/05/2017; Time: 9:15 AM  Patient Instructions  Today we discussed the following:  1. Please wean yourself off of Ativan. You will not be considered for opioid therapy at this clinic if you are on benzodiazepines.  2. Referral to pain psychology regarding risk of chronic opioid therapy.  3. Lumbar epidural steroid injection in January after patient's insurance resets. Can also consider lumbar facets for lumbar spondylosis.   4. Follow-up in 2 months or earlier once you have weaned herself off Ativan and once you have seen the pain psychologist.  Epidural Steroid Injection Patient Information  Description: The epidural space surrounds the nerves as they exit the spinal cord.  In some patients, the nerves can be compressed and inflamed by a bulging disc or a tight spinal canal (spinal stenosis).  By injecting steroids into the epidural space, we can bring irritated nerves into direct contact with a potentially helpful medication.  These steroids act directly on the irritated nerves and can reduce swelling and inflammation which often leads to decreased pain.  Epidural steroids may be injected anywhere along the spine and from the neck to the low back depending upon the location of your pain.   After  numbing the skin with local anesthetic (like  Novocaine), a small needle is passed into the epidural space slowly.  You may experience a sensation of pressure while this is being done.  The entire block usually last less than 10 minutes.  Conditions which may be treated by epidural steroids:   Low back and leg pain  Neck and arm pain  Spinal stenosis  Post-laminectomy syndrome  Herpes zoster (shingles) pain  Pain from compression fractures  Preparation for the injection:  1. Do not eat any solid food or dairy products within 3 hours of your appointment.  2. You may drink clear liquids up to 3 hours before appointment.  Clear liquids include water, black coffee, juice or soda.  No milk or cream please. 3. You may take your regular medication, including pain medications, with a sip of water before your appointment  Diabetics should hold regular insulin (if taken separately) and take 1/2 normal NPH dos the morning of the procedure.  Carry some sugar containing items with you to your appointment. 4. Bring all your current medications with your. 5. An IV may be inserted and sedation may be given at the discretion of the physician.   6. A blood pressure cuff, EKG and other monitors will often be applied during the procedure.  Some patients may need to have extra oxygen administered for a short period. 7. You will be asked to provide medical information, including your allergies, prior to the procedure.  We must know immediately if you are taking blood thinners (like Coumadin/Warfarin)  Or if you are allergic to IV iodine contrast (dye). We must know if you could possible be pregnant.  Possible side-effects:  Bleeding from needle site  Infection (rare, may require surgery)  Nerve injury (rare)  Numbness & tingling (temporary)  Difficulty urinating (rare, temporary)  Spinal headache ( a headache worse with upright posture)  Light -headedness (temporary)  Pain at injection site (several days)  Decreased blood pressure  (temporary)  Weakness in arm/leg (temporary)  Pressure sensation in back/neck (temporary)  Call if you experience:  Fever/chills associated with headache or increased back/neck pain.  Headache worsened by an upright position.  New onset weakness or numbness of an extremity below the injection site  Hives or difficulty breathing (go to the emergency room)  Inflammation or drainage at the infection site  Severe back/neck pain  Any new symptoms which are concerning to you  Please note:  Although the local anesthetic injected can often make your back or neck feel good for several hours after the injection, the pain will likely return.  It takes 3-7 days for steroids to work in the epidural space.  You may not notice any pain relief for at least that one week.  If effective, we will often do a series of three injections spaced 3-6 weeks apart to maximally decrease your pain.  After the initial series, we generally will wait several months before considering a repeat injection of the same type.  If you have any questions, please call [phone] [facility]

## 2017-05-17 ENCOUNTER — Other Ambulatory Visit: Payer: Self-pay | Admitting: Internal Medicine

## 2017-05-17 DIAGNOSIS — Z1231 Encounter for screening mammogram for malignant neoplasm of breast: Secondary | ICD-10-CM

## 2017-06-13 ENCOUNTER — Other Ambulatory Visit: Payer: Self-pay | Admitting: Student in an Organized Health Care Education/Training Program

## 2017-06-13 ENCOUNTER — Ambulatory Visit
Payer: BC Managed Care – PPO | Attending: Student in an Organized Health Care Education/Training Program | Admitting: Student in an Organized Health Care Education/Training Program

## 2017-06-13 ENCOUNTER — Encounter: Payer: Self-pay | Admitting: Student in an Organized Health Care Education/Training Program

## 2017-06-13 ENCOUNTER — Other Ambulatory Visit: Payer: Self-pay

## 2017-06-13 VITALS — BP 148/94 | HR 71 | Temp 98.0°F | Resp 16 | Ht 68.0 in | Wt 214.0 lb

## 2017-06-13 DIAGNOSIS — I10 Essential (primary) hypertension: Secondary | ICD-10-CM | POA: Diagnosis not present

## 2017-06-13 DIAGNOSIS — F329 Major depressive disorder, single episode, unspecified: Secondary | ICD-10-CM | POA: Insufficient documentation

## 2017-06-13 DIAGNOSIS — Z5181 Encounter for therapeutic drug level monitoring: Secondary | ICD-10-CM | POA: Diagnosis present

## 2017-06-13 DIAGNOSIS — F419 Anxiety disorder, unspecified: Secondary | ICD-10-CM | POA: Diagnosis not present

## 2017-06-13 DIAGNOSIS — F1120 Opioid dependence, uncomplicated: Secondary | ICD-10-CM | POA: Insufficient documentation

## 2017-06-13 DIAGNOSIS — G894 Chronic pain syndrome: Secondary | ICD-10-CM | POA: Diagnosis not present

## 2017-06-13 DIAGNOSIS — M48061 Spinal stenosis, lumbar region without neurogenic claudication: Secondary | ICD-10-CM | POA: Diagnosis not present

## 2017-06-13 DIAGNOSIS — M47816 Spondylosis without myelopathy or radiculopathy, lumbar region: Secondary | ICD-10-CM | POA: Diagnosis not present

## 2017-06-13 DIAGNOSIS — M9983 Other biomechanical lesions of lumbar region: Secondary | ICD-10-CM

## 2017-06-13 DIAGNOSIS — Z87891 Personal history of nicotine dependence: Secondary | ICD-10-CM | POA: Insufficient documentation

## 2017-06-13 DIAGNOSIS — Z8639 Personal history of other endocrine, nutritional and metabolic disease: Secondary | ICD-10-CM | POA: Diagnosis not present

## 2017-06-13 DIAGNOSIS — M5030 Other cervical disc degeneration, unspecified cervical region: Secondary | ICD-10-CM | POA: Insufficient documentation

## 2017-06-13 DIAGNOSIS — F32A Depression, unspecified: Secondary | ICD-10-CM

## 2017-06-13 NOTE — Patient Instructions (Signed)
1. UDS today 2. Follow up next Wednesday for MM

## 2017-06-13 NOTE — Progress Notes (Signed)
Safety precautions to be maintained throughout the outpatient stay will include: orient to surroundings, keep bed in low position, maintain call bell within reach at all times, provide assistance with transfer out of bed and ambulation.  

## 2017-06-13 NOTE — Progress Notes (Signed)
Patient's Name: Madeline Zimmerman Basic  MRN: [id number]  Referring Provider: Marguarite ArbourSparks, Jeffrey D, MD  DOB: 10/01/1960  PCP: Marguarite ArbourSparks, Jeffrey D, MD  DOS: 06/13/2017  Note by: Edward JollyBilal Khalin Royce, MD  Service setting: Ambulatory outpatient  Specialty: Interventional Pain Management  Location: [facility] (AMB) Pain Management Facility    Patient type: Established   Primary Reason(s) for Visit: Encounter for prescription drug management. (Level of risk: moderate)  CC: Back Pain (lower)  HPI  Madeline Zimmerman is a 56 y.o. year old, female patient, who comes today for a medication management evaluation. She has Anxiety and depression; Benign hypertension; Chronic pain; Degeneration of intervertebral disc of lumbar region; Personal history of other endocrine, nutritional and metabolic disease; and Opioid dependence (HCC) on their problem list. Her primarily concern today is the Back Pain (lower)  Pain Assessment: Location: Lower Back Radiating: left leg to the foot Onset: More than a month ago Duration: Chronic pain Quality: Burning Severity: 3 /10 (self-reported pain score)  Note: Reported level is inconsistent with clinical observations. Clinically the patient looks like a 2/10 A 2/10 is viewed as "Mild to Moderate" and described as noticeable and distracting. Impossible to hide from other people. More frequent flare-ups. Still possible to adapt and function close to normal. It can be very annoying and may have occasional stronger flare-ups. With discipline, patients may get used to it and adapt.       When using our objective Pain Scale, levels between 6 and 10/10 are said to belong in an emergency room, as it progressively worsens from a 6/10, described as severely limiting, requiring emergency care not usually available at an outpatient pain management facility. At a 6/10 level, communication becomes difficult and requires great effort. Assistance to reach the emergency department may be required. Facial flushing and profuse sweating  along with potentially dangerous increases in heart rate and blood pressure will be evident. Effect on ADL:   Timing: Intermittent Modifying factors: medications, stretching, rest  Madeline Zimmerman was last scheduled for an appointment on 05/05/2017 for medication management. During today's appointment we reviewed Madeline Zimmerman's chronic pain status, as well as her outpatient medication regimen.  Patient has seen pain psychology and is deemed low risk for substance abuse potential.  She states that she has stopped her Ativan.  We will complete a urine drug screen today and have her follow-up next week to discuss chronic opioid therapy.  She states that her urine drug screen will be positive for oxycodone given that she had breast revision surgery last Thursday.   The patient  reports that she does not use drugs. Her body mass index is 32.54 kg/m.  Further details on both, my assessment(s), as well as the proposed treatment plan, please see below.   Recent Diagnostic Imaging Results  MR Lumbar Spine Wo Contrast CLINICAL DATA:  56 year old female with low back pain for 25 years worse in the past 3 months. Radiates to left leg and both hips. Initial encounter.  EXAM: MRI LUMBAR SPINE WITHOUT CONTRAST  TECHNIQUE: Multiplanar, multisequence MR imaging of the lumbar spine was performed. No intravenous contrast was administered.  COMPARISON:  05/09/2015 CT abdomen pelvis.  FINDINGS: Segmentation: Last fully open disk space is labeled L5-S1. Present examination incorporates from T11-12 disc space through the lower S2 level.  Alignment:  Curvature of the lumbar spine convex left.  Vertebrae: No worrisome osseous abnormality. Discogenic changes surround the L5-S1 disc space.  Conus medullaris: Extends to the L1 level and appears normal.  Paraspinal and  other soft tissues: No worrisome abnormality.  Disc levels:  T11-12: Minimal Schmorl's node deformity inferior endplate T11.  T12-L1:   Minimal Schmorl's node deformity inferior endplate T12.  L1-2: Minimal bulge greater to the right. Minimal facet degenerative changes.  L2-3: Mild to slightly moderate facet degenerative changes. Minimal bulge greater to the right. Minimal narrowing lateral aspect of the thecal sac greater on the right.  L3-4: Prominent facet degenerative changes greater on the right. Ligamentum flavum hypertrophy. Minimal anterior slip L3. Bulge. Slightly short pedicles. Dorsal epidural fat. Multifactorial mild thecal sac narrowing. Minimal right foraminal narrowing.  L4-5: Prominent disc degeneration with disc space narrowing. Bulge and osteophyte with greater extension right foraminal/ lateral position without compression of the exiting right L4 nerve root. Moderate facet degenerative changes greater on right. Ligamentum flavum hypertrophy. Mild narrowing lateral aspect of the thecal sac greater on right with minimal crowding of the contained right L5 nerve root.  L5-S1: Moderate facet degenerative changes. Disc degeneration with endplate reactive changes. Bulge and osteophyte with foraminal/ lateral extension greater on the left. Crowding of the course the exiting L5 nerve root greater on the left. Mild lateral recess narrowing greater on left.  IMPRESSION: L5-S1 bulge and osteophyte with foraminal/ lateral extension greater on the left. Crowding of the course the exiting L5 nerve root greater on the left. Mild lateral recess narrowing greater on left.  L4-5 bulge and osteophyte with greater extension right foraminal/ lateral position without compression of the exiting right L4 nerve root. Multifactorial mild narrowing lateral aspect of the thecal sac greater on right with minimal crowding of the contained right L5 nerve root.  L3-4 multifactorial mild thecal sac narrowing. Minimal right foraminal narrowing.  L2-3 minimal narrowing lateral aspect of the thecal sac greater on the  right.  Facet degenerative changes lower lumbar spine as detailed above.  Electronically Signed   By: Lacy Duverney M.D.   On: 03/23/2016 11:43  Complexity Note: Imaging results reviewed. Results shared with Ms. Madeline Goodell, using Layman's terms.                         Meds   Current Outpatient Medications:  .  bisoprolol-hydrochlorothiazide (ZIAC) 5-6.25 MG tablet, Take 1 tablet by mouth daily., Disp: , Rfl:  .  fluticasone (FLONASE) 50 MCG/ACT nasal spray, Place into the nose., Disp: , Rfl:  .  ibuprofen (ADVIL) 200 MG tablet, Take 200 mg by mouth., Disp: , Rfl:  .  LYRICA 75 MG capsule, Take 75 mg by mouth 3 (three) times daily., Disp: , Rfl: 5 .  PARoxetine (PAXIL) 20 MG tablet, TAKE 1 TABLET (20 MG TOTAL) BY MOUTH ONCE DAILY., Disp: , Rfl:  .  estradiol (VIVELLE-DOT) 0.1 MG/24HR patch, Place onto the skin., Disp: , Rfl:  .  LORazepam (ATIVAN) 0.5 MG tablet, TAKE 1 TABLET BY MOUTH 3 TIMES DAILY AS NEEDED FOR ANXIETY, Disp: , Rfl:  .  meloxicam (MOBIC) 7.5 MG tablet, TAKE 1 TABLET BY MOUTH EVERY DAY, Disp: , Rfl:  .  norethindrone (AYGESTIN) 5 MG tablet, Take by mouth., Disp: , Rfl:  .  traMADol (ULTRAM) 50 MG tablet, Take by mouth., Disp: , Rfl:   ROS  Constitutional: Denies any fever or chills Gastrointestinal: No reported hemesis, hematochezia, vomiting, or acute GI distress Musculoskeletal: Denies any acute onset joint swelling, redness, loss of ROM, or weakness Neurological: No reported episodes of acute onset apraxia, aphasia, dysarthria, agnosia, amnesia, paralysis, loss of coordination, or loss of  consciousness  Allergies  Madeline Zimmerman is allergic to bacitracin and neosporin  [neomycin-bacitracin zn-polymyx].  PFSH  Drug: Madeline Zimmerman  reports that she does not use drugs. Alcohol:  reports that she does not drink alcohol. Tobacco:  reports that she has quit smoking. she has never used smokeless tobacco. Medical:  has a past medical history of Anxiety and depression, Chronic  pain, DDD (degenerative disc disease), cervical, Graves disease, and HTN (hypertension). Surgical: Madeline Zimmerman  has a past surgical history that includes Reduction mammaplasty (2012) and revision breast reduction (2013). Family: family history includes Heart failure in her father; Hypertension in her mother.  Constitutional Exam  General appearance: Well nourished, well developed, and well hydrated. In no apparent acute distress Vitals:   06/13/17 1405  BP: (!) 148/94  Pulse: 71  Resp: 16  Temp: 98 F (36.7 Zimmerman)  TempSrc: Oral  SpO2: 98%  Weight: 214 lb (97.1 kg)  Height: 5\' 8"  (1.727 m)   BMI Assessment: Estimated body mass index is 32.54 kg/m as calculated from the following:   Height as of this encounter: 5\' 8"  (1.727 m).   Weight as of this encounter: 214 lb (97.1 kg).  BMI interpretation table: BMI level Category Range association with higher incidence of chronic pain  <18 kg/m2 Underweight   18.5-24.9 kg/m2 Ideal body weight   25-29.9 kg/m2 Overweight Increased incidence by 20%  30-34.9 kg/m2 Obese (Class I) Increased incidence by 68%  35-39.9 kg/m2 Severe obesity (Class II) Increased incidence by 136%  >40 kg/m2 Extreme obesity (Class III) Increased incidence by 254%   BMI Readings from Last 4 Encounters:  06/13/17 32.54 kg/m  05/05/17 31.17 kg/m  06/12/15 32.60 kg/m  04/28/15 32.41 kg/m   Wt Readings from Last 4 Encounters:  06/13/17 214 lb (97.1 kg)  05/05/17 205 lb (93 kg)  06/12/15 208 lb 1.8 oz (94.4 kg)  04/28/15 206 lb 14.4 oz (93.8 kg)  Psych/Mental status: Alert, oriented x 3 (person, place, & time)       Eyes: PERLA Respiratory: No evidence of acute respiratory distress  Cervical Spine Area Exam  Skin & Axial Inspection: No masses, redness, edema, swelling, or associated skin lesions Alignment: Symmetrical Functional ROM: Unrestricted ROM      Stability: No instability detected Muscle Tone/Strength: Functionally intact. No obvious neuro-muscular  anomalies detected. Sensory (Neurological): Unimpaired Palpation: No palpable anomalies              Upper Extremity (UE) Exam    Side: Right upper extremity  Side: Left upper extremity  Skin & Extremity Inspection: Skin color, temperature, and hair growth are WNL. No peripheral edema or cyanosis. No masses, redness, swelling, asymmetry, or associated skin lesions. No contractures.  Skin & Extremity Inspection: Skin color, temperature, and hair growth are WNL. No peripheral edema or cyanosis. No masses, redness, swelling, asymmetry, or associated skin lesions. No contractures.  Functional ROM: Unrestricted ROM          Functional ROM: Unrestricted ROM          Muscle Tone/Strength: Functionally intact. No obvious neuro-muscular anomalies detected.  Muscle Tone/Strength: Functionally intact. No obvious neuro-muscular anomalies detected.  Sensory (Neurological): Unimpaired          Sensory (Neurological): Unimpaired          Palpation: No palpable anomalies              Palpation: No palpable anomalies              Specialized  Test(s): Deferred         Specialized Test(s): Deferred          Thoracic Spine Area Exam  Skin & Axial Inspection: No masses, redness, or swelling Alignment: Symmetrical Functional ROM: Unrestricted ROM Stability: No instability detected Muscle Tone/Strength: Functionally intact. No obvious neuro-muscular anomalies detected. Sensory (Neurological): Unimpaired Muscle strength & Tone: No palpable anomalies  Lumbar Spine Area Exam  Skin & Axial Inspection: No masses, redness, or swelling Alignment: Symmetrical Functional ROM: Unrestricted ROM      Stability: No instability detected Muscle Tone/Strength: Functionally intact. No obvious neuro-muscular anomalies detected. Sensory (Neurological): Unimpaired Palpation: No palpable anomalies       Provocative Tests: Lumbar Hyperextension and rotation test: evaluation deferred today       Lumbar Lateral bending test:  evaluation deferred today       Patrick's Maneuver: evaluation deferred today                    Gait & Posture Assessment  Ambulation: Unassisted Gait: Relatively normal for age and body habitus Posture: WNL   Lower Extremity Exam    Side: Right lower extremity  Side: Left lower extremity  Skin & Extremity Inspection: Skin color, temperature, and hair growth are WNL. No peripheral edema or cyanosis. No masses, redness, swelling, asymmetry, or associated skin lesions. No contractures.  Skin & Extremity Inspection: Skin color, temperature, and hair growth are WNL. No peripheral edema or cyanosis. No masses, redness, swelling, asymmetry, or associated skin lesions. No contractures.  Functional ROM: Unrestricted ROM          Functional ROM: Unrestricted ROM          Muscle Tone/Strength: Functionally intact. No obvious neuro-muscular anomalies detected.  Muscle Tone/Strength: Functionally intact. No obvious neuro-muscular anomalies detected.  Sensory (Neurological): Unimpaired  Sensory (Neurological): Unimpaired  Palpation: No palpable anomalies  Palpation: No palpable anomalies   Assessment  Primary Diagnosis & Pertinent Problem List: The primary encounter diagnosis was Lumbar spondylosis. Diagnoses of Lumbar facet arthropathy, Lumbar foraminal stenosis, Spinal stenosis of lumbar region without neurogenic claudication, Anxiety and depression, and Chronic pain syndrome were also pertinent to this visit.  Status Diagnosis  Persistent Persistent Persistent 1. Lumbar spondylosis   2. Lumbar facet arthropathy   3. Lumbar foraminal stenosis   4. Spinal stenosis of lumbar region without neurogenic claudication   5. Anxiety and depression   6. Chronic pain syndrome      General Recommendations: The pain condition that the patient suffers from is best treated with a multidisciplinary approach that involves an increase in physical activity to prevent de-conditioning and worsening of the pain  cycle, as well as psychological counseling (formal and/or informal) to address the co-morbid psychological affects of pain. Treatment will often involve judicious use of pain medications and interventional procedures to decrease the pain, allowing the patient to participate in the physical activity that will ultimately produce long-lasting pain reductions. The goal of the multidisciplinary approach is to return the patient to a higher level of overall function and to restore their ability to perform activities of daily living.  56 year old female with a history of axial low back pain left greater than right with radiation into the left leg. Back pain is chronic in nature and could be secondary to L5-S1 disc bulge causing foraminal and lateral recess compression specifically involving the L5 nerve root on the left. Patient also has multilevel lumbar degenerative disc disease and facet arthropathy, spondylosis that could be explaining  her localized axial back pain which is different in quality and nature that her sharp shooting pain, intermittent, and her left leg. We discussed lumbar epidural steroid injections as well as lumbar diagnostic facet blocks with goal radiofrequency ablation. Patient states that she is interested however would like to wait until January when her insurance pricing resets.  Patient has been on many medications in the past which are included below which were not effective including gabapentin, Lyrica, various muscle relaxants, NSAIDs.  She states that she has stopped her benzodiazepine intake and that her urine drug screen should be negative for Ativan.  She states that she did have a breast revision surgery last Thursday so her urine drug screen will be positive for oxycodone.  We will complete urine drug screen today and I will have the patient follow-up with me next week to discuss medication management and have her complete opioid agreement with our clinic pending her urine drug  screen today.  She has already seen pain psychology and is deemed mild risk for substance abuse disorder.  Plan: -UDS today expect this to be positive for oxycodone given patient's breast revision surgery last Thursday.  I expect this to be negative for Ativan. -Left L5-S1 ESI after January per patient request regarding insurance -Follow-up in 1 week after urine drug screen to discuss chronic opioid therapy.  Will discuss tramadol versus Butrans versus hydrocodone.  Lab-work, procedure(s), and/or referral(s): Orders Placed This Encounter  Procedures  . Compliance Drug Analysis, Ur    Opioid Analgesics: The patient was informed that there is no guarantee that she would be a candidate for opioid analgesics. The decision will be made following CDC guidelines. This decision will be based on the results of diagnostic studies, as well as Madeline Zimmerman's risk profile. Consider Nucynta, Butrans, hydrocodone.   Membrane stabilizer: Tried and failed including gabapentin, Lyrica. Can consider Topamax in the future.   Muscle relaxant: Tried and failed or resulted in side effects: Flexeril. May consider baclofen tizanidine patient introduced in the future.   NSAID: Has tried Mobic. Can consider by mouth diclofenac  Other analgesic(s): To be determined at a later time   Interventional management options: Madeline Zimmerman was informed that there is no guarantee that she would be a candidate for interventional therapies. The decision will be based on the results of diagnostic studies, as well as Madeline Zimmerman's risk profile.  Procedure(s) under consideration:  -Left L5-S1 epidural steroid injection (after January once insurance resets ) -Bilateral lumbar diagnostic facet blocks with goal radio frequency ablation -Left sacroiliac joint injection      Provider-requested follow-up: Return in about 9 days (around 06/22/2017) for Medication Management.  Future Appointments  Date Time Provider Department Center   06/22/2017  8:45 AM Edward JollyLateef, Glendoris Nodarse, MD [facility] None    Primary Care Physician: Marguarite ArbourSparks, Jeffrey D, MD [facility] Note by: Edward JollyBilal Koraline Phillipson, M.D Date: 06/13/2017; Time: 4:17 PM  Patient Instructions  1. UDS today 2. Follow up next Wednesday for MM

## 2017-06-14 ENCOUNTER — Ambulatory Visit: Payer: BC Managed Care – PPO | Admitting: Student in an Organized Health Care Education/Training Program

## 2017-06-17 LAB — COMPLIANCE DRUG ANALYSIS, UR

## 2017-06-22 ENCOUNTER — Encounter: Payer: Self-pay | Admitting: Student in an Organized Health Care Education/Training Program

## 2017-06-22 ENCOUNTER — Ambulatory Visit
Payer: BC Managed Care – PPO | Attending: Student in an Organized Health Care Education/Training Program | Admitting: Student in an Organized Health Care Education/Training Program

## 2017-06-22 ENCOUNTER — Other Ambulatory Visit: Payer: Self-pay

## 2017-06-22 VITALS — BP 146/101 | HR 98 | Temp 98.2°F | Ht 68.0 in | Wt 212.0 lb

## 2017-06-22 DIAGNOSIS — E0500 Thyrotoxicosis with diffuse goiter without thyrotoxic crisis or storm: Secondary | ICD-10-CM | POA: Diagnosis not present

## 2017-06-22 DIAGNOSIS — F1120 Opioid dependence, uncomplicated: Secondary | ICD-10-CM | POA: Insufficient documentation

## 2017-06-22 DIAGNOSIS — F419 Anxiety disorder, unspecified: Secondary | ICD-10-CM | POA: Diagnosis not present

## 2017-06-22 DIAGNOSIS — M2578 Osteophyte, vertebrae: Secondary | ICD-10-CM | POA: Diagnosis not present

## 2017-06-22 DIAGNOSIS — Z8639 Personal history of other endocrine, nutritional and metabolic disease: Secondary | ICD-10-CM | POA: Insufficient documentation

## 2017-06-22 DIAGNOSIS — M5136 Other intervertebral disc degeneration, lumbar region: Secondary | ICD-10-CM | POA: Diagnosis not present

## 2017-06-22 DIAGNOSIS — I10 Essential (primary) hypertension: Secondary | ICD-10-CM | POA: Insufficient documentation

## 2017-06-22 DIAGNOSIS — M47816 Spondylosis without myelopathy or radiculopathy, lumbar region: Secondary | ICD-10-CM | POA: Diagnosis not present

## 2017-06-22 DIAGNOSIS — Z79899 Other long term (current) drug therapy: Secondary | ICD-10-CM | POA: Diagnosis not present

## 2017-06-22 DIAGNOSIS — G894 Chronic pain syndrome: Secondary | ICD-10-CM | POA: Insufficient documentation

## 2017-06-22 DIAGNOSIS — M545 Low back pain: Secondary | ICD-10-CM | POA: Diagnosis present

## 2017-06-22 DIAGNOSIS — F329 Major depressive disorder, single episode, unspecified: Secondary | ICD-10-CM | POA: Insufficient documentation

## 2017-06-22 DIAGNOSIS — Z87891 Personal history of nicotine dependence: Secondary | ICD-10-CM | POA: Diagnosis not present

## 2017-06-22 MED ORDER — HYDROCODONE-ACETAMINOPHEN 10-325 MG PO TABS: 1.0000 | ORAL_TABLET | Freq: Three times a day (TID) | ORAL | 0 refills | Status: DC | PRN

## 2017-06-22 NOTE — Progress Notes (Signed)
Safety precautions to be maintained throughout the outpatient stay will include: orient to surroundings, keep bed in low position, maintain call bell within reach at all times, provide assistance with transfer out of bed and ambulation.  

## 2017-06-22 NOTE — Progress Notes (Signed)
Patient's Name: Madeline Zimmerman  MRN: [id number]  Referring Provider: Idelle Crouch, MD  DOB: [birth year]  PCP: Idelle Crouch, MD  DOS: 06/22/2017  Note by: Gillis Santa, MD  Service setting: Ambulatory outpatient  Specialty: Interventional Pain Management  Location: [facility] (AMB) Pain Management Facility    Patient type: Established   Primary Reason(s) for Visit: Encounter for evaluation before starting new chronic pain management plan of care (Level of risk: moderate) CC: Back Pain (lower)  HPI  Madeline Zimmerman is a 56 y.o. year old, female patient, who comes today for a follow-up evaluation to review the test results and decide on a treatment plan. She has Anxiety and depression; Benign hypertension; Chronic pain; Degeneration of intervertebral disc of lumbar region; Personal history of other endocrine, nutritional and metabolic disease; and Opioid dependence ([place]) on their problem list. Her primarily concern today is the Back Pain (lower)  Pain Assessment: Location: Lower Back Radiating: radiated down left leg Onset: More than a month ago Duration: Chronic pain Quality: Aching, Burning, Constant Severity: 4 /10 (self-reported pain score)  Note: Reported level is inconsistent with clinical observations. Clinically the patient looks like a 3/10 A 3/10 is viewed as "Moderate" and described as significantly interfering with activities of daily living (ADL). It becomes difficult to feed, bathe, get dressed, get on and off the toilet or to perform personal hygiene functions. Difficult to get in and out of bed or a chair without assistance. Very distracting. With effort, it can be ignored when deeply involved in activities.       When using our objective Pain Scale, levels between 6 and 10/10 are said to belong in an emergency room, as it progressively worsens from a 6/10, described as severely limiting, requiring emergency care not usually available at an outpatient pain management facility. At a 6/10  level, communication becomes difficult and requires great effort. Assistance to reach the emergency department may be required. Facial flushing and profuse sweating along with potentially dangerous increases in heart rate and blood pressure will be evident. Effect on ADL:   Timing: Constant Modifying factors: rest, meds, exercise  Patient comes in today after her appointment on June 13, 2017.  At that time she has completed her assessment with pain psychology and deemed low risk for substance abuse disorder.  We completed a urine drug screen at that time.  Results of that have been reviewed and are appropriate.  We will have patient sign opioid contract and continue her opioid therapy as before.  Further details on both, my assessment(s), as well as the proposed treatment plan, please see below.  Controlled Substance Pharmacotherapy Assessment REMS (Risk Evaluation and Mitigation Strategy)  Analgesic: Hydrocodone 10 mg 3 times daily daily as needed MME/day: Approximately 30 mg/day. Pill Count: None expected due to no prior prescriptions written by our practice. Chauncey Fischer, RN  06/22/2017  9:05 AM  Sign at close encounter Safety precautions to be maintained throughout the outpatient stay will include: orient to surroundings, keep bed in low position, maintain call bell within reach at all times, provide assistance with transfer out of bed and ambulation.    Pharmacokinetics: Liberation and absorption (onset of action): WNL Distribution (time to peak effect): WNL Metabolism and excretion (duration of action): WNL         Pharmacodynamics: Desired effects: Analgesia: Madeline Zimmerman reports >50% benefit. Functional ability: Patient reports that medication allows her to accomplish basic ADLs Clinically meaningful improvement in function (CMIF): Sustained CMIF goals met  Perceived effectiveness: Described as relatively effective, allowing for increase in activities of daily living  (ADL) Undesirable effects: Side-effects or Adverse reactions: None reported Monitoring: [place] PMP: Online review of the past 45-monthperiod previously conducted. Not applicable at this point since we have not taken over the patient's medication management yet. List of other Serum/Urine Drug Screening Test(s):  No results found for: AMPHSCRSER, BARBSCRSER, BENZOSCRSER, COCAINSCRSER, COCAINSCRNUR, PCPSCRSER, THCSCRSER, THCU, CANNABQUANT, [place] of all UDS test(s) done:  Lab Results  Component Value Date   SUMMARY FINAL 06/13/2017   Last UDS on record: Summary  Date Value Ref Range Status  06/13/2017 FINAL  Final    Comment:    ==================================================================== TOXASSURE COMP DRUG ANALYSIS,UR ==================================================================== Test                             Result       Flag       Units Drug Present and Declared for Prescription Verification   Pregabalin                     PRESENT      EXPECTED Drug Present not Declared for Prescription Verification   Oxymorphone                    94           UNEXPECTED ng/mg creat   Noroxycodone                   574          UNEXPECTED ng/mg creat    Oxymorphone and noroxycodone are expected metabolites of    oxycodone. Sources of oxycodone are scheduled prescription    medications. Oxymorphone is also available as a scheduled    prescription medication.   Acetaminophen                  PRESENT      UNEXPECTED Drug Absent but Declared for Prescription Verification   Lorazepam                      Not Detected UNEXPECTED ng/mg creat   Tramadol                       Not Detected UNEXPECTED ng/mg creat   Paroxetine                     Not Detected UNEXPECTED   Ibuprofen                      Not Detected UNEXPECTED    Ibuprofen, as indicated in the declared medication list, is not    always detected even when used as  directed. ==================================================================== Test                      Result    Flag   Units      Ref Range   Creatinine              94               mg/dL      >=20 ==================================================================== Declared Medications:  The flagging and interpretation on this report are based on the  following declared medications.  Unexpected results may arise from  inaccuracies in the declared medications.  **Note: The  testing scope of this panel includes these medications:  Lorazepam  Paroxetine  Pregabalin (Lyrica)  Tramadol  **Note: The testing scope of this panel does not include small to  moderate amounts of these reported medications:  Ibuprofen  **Note: The testing scope of this panel does not include following  reported medications:  Bisoprolol  Estradiol  Fluticasone  Hydrochlorothiazide  Meloxicam  Norethindrone ==================================================================== For clinical consultation, please call [phone]. ====================================================================    UDS interpretation: No unexpected findings.          Medication Assessment Form: Patient introduced to form today Treatment compliance: Treatment may start today if patient agrees with proposed plan. Evaluation of compliance is not applicable at this point Risk Assessment Profile: Aberrant behavior: See initial evaluations. None observed or detected today Comorbid factors increasing risk of overdose: See initial evaluation. No additional risks detected today Medical Psychology Evaluation: Please see scanned results in medical record. Opioid Risk Tool - 06/22/17 0904      Family History of Substance Abuse   Alcohol  Negative    Illegal Drugs  Negative    Rx Drugs  Negative      Personal History of Substance Abuse   Alcohol  Negative    Illegal Drugs  Negative    Rx Drugs  Negative      Age   Age  between 59-45 years   No      History of Preadolescent Sexual Abuse   History of Preadolescent Sexual Abuse  Negative or Female      Psychological Disease   Psychological Disease  Negative    Depression  Negative      Total Score   Opioid Risk Tool Scoring  0    Opioid Risk Interpretation  Low Risk      ORT Scoring interpretation table:  Score <3 = Low Risk for SUD  Score between 4-7 = Moderate Risk for SUD  Score >8 = High Risk for Opioid Abuse   Risk Mitigation Strategies:  Patient opioid safety counseling: Completed today. Counseling provided to patient as per "Patient Counseling Document". Document signed by patient, attesting to counseling and understanding Patient-Prescriber Agreement (PPA): Obtained today.  Controlled substance notification to other providers: Written and sent today.  Pharmacologic Plan: Today we may be taking over the patient's pharmacological regimen. See below             Laboratory Chemistry   Coagulation Parameters Lab Results  Component Value Date   PLT 385 06/12/2015                 Cardiovascular Markers Lab Results  Component Value Date   HGB 16.2 (H) 06/12/2015   HCT 49.0 (H) 06/12/2015                 CA Markers No results found for: CEA, CA125, LABCA2               Note: Lab results reviewed.  Recent Diagnostic Imaging Review    Lumbosacral Imaging: Lumbar MR wo contrast:  Results for orders placed during the hospital encounter of 03/23/16  MR Lumbar Spine Wo Contrast   Narrative CLINICAL DATA:  56 year old female with low back pain for 25 years worse in the past 3 months. Radiates to left leg and both hips. Initial encounter.  EXAM: MRI LUMBAR SPINE WITHOUT CONTRAST  TECHNIQUE: Multiplanar, multisequence MR imaging of the lumbar spine was performed. No intravenous contrast was administered.  COMPARISON:  05/09/2015 CT abdomen pelvis.  FINDINGS:  Segmentation: Last fully open disk space is labeled L5-S1.  Present examination incorporates from T11-12 disc space through the lower S2 level.  Alignment:  Curvature of the lumbar spine convex left.  Vertebrae: No worrisome osseous abnormality. Discogenic changes surround the L5-S1 disc space.  Conus medullaris: Extends to the L1 level and appears normal.  Paraspinal and other soft tissues: No worrisome abnormality.  Disc levels:  T11-12: Minimal Schmorl's node deformity inferior endplate T11.  S06-T0:  Minimal Schmorl's node deformity inferior endplate T12.  Z6-0: Minimal bulge greater to the right. Minimal facet degenerative changes.  L2-3: Mild to slightly moderate facet degenerative changes. Minimal bulge greater to the right. Minimal narrowing lateral aspect of the thecal sac greater on the right.  L3-4: Prominent facet degenerative changes greater on the right. Ligamentum flavum hypertrophy. Minimal anterior slip L3. Bulge. Slightly short pedicles. Dorsal epidural fat. Multifactorial mild thecal sac narrowing. Minimal right foraminal narrowing.  L4-5: Prominent disc degeneration with disc space narrowing. Bulge and osteophyte with greater extension right foraminal/ lateral position without compression of the exiting right L4 nerve root. Moderate facet degenerative changes greater on right. Ligamentum flavum hypertrophy. Mild narrowing lateral aspect of the thecal sac greater on right with minimal crowding of the contained right L5 nerve root.  L5-S1: Moderate facet degenerative changes. Disc degeneration with endplate reactive changes. Bulge and osteophyte with foraminal/ lateral extension greater on the left. Crowding of the course the exiting L5 nerve root greater on the left. Mild lateral recess narrowing greater on left.  IMPRESSION: L5-S1 bulge and osteophyte with foraminal/ lateral extension greater on the left. Crowding of the course the exiting L5 nerve root greater on the left. Mild lateral recess narrowing  greater on left.  L4-5 bulge and osteophyte with greater extension right foraminal/ lateral position without compression of the exiting right L4 nerve root. Multifactorial mild narrowing lateral aspect of the thecal sac greater on right with minimal crowding of the contained right L5 nerve root.  L3-4 multifactorial mild thecal sac narrowing. Minimal right foraminal narrowing.  L2-3 minimal narrowing lateral aspect of the thecal sac greater on the right.  Facet degenerative changes lower lumbar spine as detailed above.   Electronically Signed   By: Genia Del M.D.   On: 03/23/2016 11:43    Complexity Note: Imaging results reviewed. Results shared with Ms. Henrene Pastor, using Layman's terms.                         Meds   Current Outpatient Medications:  .  bisoprolol-hydrochlorothiazide (ZIAC) 5-6.25 MG tablet, Take 1 tablet by mouth daily., Disp: , Rfl:  .  fluticasone (FLONASE) 50 MCG/ACT nasal spray, Place into the nose., Disp: , Rfl:  .  ibuprofen (ADVIL) 200 MG tablet, Take 200 mg by mouth., Disp: , Rfl:  .  LYRICA 75 MG capsule, Take 75 mg by mouth 3 (three) times daily., Disp: , Rfl: 5 .  PARoxetine (PAXIL) 20 MG tablet, TAKE 1 TABLET (20 MG TOTAL) BY MOUTH ONCE DAILY., Disp: , Rfl:  .  estradiol (VIVELLE-DOT) 0.1 MG/24HR patch, Place onto the skin., Disp: , Rfl:  .  HYDROcodone-acetaminophen (NORCO) 10-325 MG tablet, Take 1 tablet by mouth 3 (three) times daily as needed. For chronic pain To last for 30 days from fill date., Disp: 90 tablet, Rfl: 0 .  meloxicam (MOBIC) 7.5 MG tablet, TAKE 1 TABLET BY MOUTH EVERY DAY, Disp: , Rfl:  .  norethindrone (AYGESTIN) 5 MG tablet,  Take by mouth., Disp: , Rfl:   ROS  Constitutional: Denies any fever or chills Gastrointestinal: No reported hemesis, hematochezia, vomiting, or acute GI distress Musculoskeletal: Denies any acute onset joint swelling, redness, loss of ROM, or weakness Neurological: No reported episodes of acute onset  apraxia, aphasia, dysarthria, agnosia, amnesia, paralysis, loss of coordination, or loss of consciousness  Allergies  Ms. Hollingshead is allergic to bacitracin and neosporin  [neomycin-bacitracin zn-polymyx].  [place]  Drug: Ms. Moritz  reports that she does not use drugs. Alcohol:  reports that she does not drink alcohol. Tobacco:  reports that she has quit smoking. she has never used smokeless tobacco. Medical:  has a past medical history of Anxiety and depression, Chronic pain, DDD (degenerative disc disease), cervical, Graves disease, and HTN (hypertension). Surgical: Ms. Dant  has a past surgical history that includes Reduction mammaplasty (2012) and revision breast reduction (2013). Family: family history includes Heart failure in her father; Hypertension in her mother.  Constitutional Exam  General appearance: Well nourished, well developed, and well hydrated. In no apparent acute distress Vitals:   06/22/17 0854  BP: (!) 146/101  Pulse: 98  Temp: 98.2 F (36.8 C)  SpO2: 98%  Weight: 212 lb (96.2 kg)  Height: _0  (1.727 m)   BMI Assessment: Estimated body mass index is 32.23 kg/m as calculated from the following:   Height as of this encounter: _1  (1.727 m).   Weight as of this encounter: 212 lb (96.2 kg).  BMI interpretation table: BMI level Category Range association with higher incidence of chronic pain  <18 kg/m2 Underweight   18.5-24.9 kg/m2 Ideal body weight   25-29.9 kg/m2 Overweight Increased incidence by 20%  30-34.9 kg/m2 Obese (Class I) Increased incidence by 68%  35-39.9 kg/m2 Severe obesity (Class II) Increased incidence by 136%  >40 kg/m2 Extreme obesity (Class III) Increased incidence by 254%   BMI Readings from Last 4 Encounters:  06/22/17 32.23 kg/m  06/13/17 32.54 kg/m  05/05/17 31.17 kg/m  06/12/15 32.60 kg/m   Wt Readings from Last 4 Encounters:  06/22/17 212 lb (96.2 kg)  06/13/17 214 lb (97.1 kg)  05/05/17 205 lb (93 kg)  06/12/15 208 lb  1.8 oz (94.4 kg)  Psych/Mental status: Alert, oriented x 3 (person, place, & time)       Eyes: PERLA Respiratory: No evidence of acute respiratory distress  Cervical Spine Area Exam  Skin & Axial Inspection: No masses, redness, edema, swelling, or associated skin lesions Alignment: Symmetrical Functional ROM: Unrestricted ROM      Stability: No instability detected Muscle Tone/Strength: Functionally intact. No obvious neuro-muscular anomalies detected. Sensory (Neurological): Unimpaired Palpation: No palpable anomalies              Upper Extremity (UE) Exam    Side: Right upper extremity  Side: Left upper extremity  Skin & Extremity Inspection: Skin color, temperature, and hair growth are WNL. No peripheral edema or cyanosis. No masses, redness, swelling, asymmetry, or associated skin lesions. No contractures.  Skin & Extremity Inspection: Skin color, temperature, and hair growth are WNL. No peripheral edema or cyanosis. No masses, redness, swelling, asymmetry, or associated skin lesions. No contractures.  Functional ROM: Unrestricted ROM          Functional ROM: Unrestricted ROM          Muscle Tone/Strength: Functionally intact. No obvious neuro-muscular anomalies detected.  Muscle Tone/Strength: Functionally intact. No obvious neuro-muscular anomalies detected.  Sensory (Neurological): Unimpaired  Sensory (Neurological): Unimpaired          Palpation: No palpable anomalies              Palpation: No palpable anomalies              Specialized Test(s): Deferred         Specialized Test(s): Deferred          Thoracic Spine Area Exam  Skin & Axial Inspection: No masses, redness, or swelling Alignment: Symmetrical Functional ROM: Unrestricted ROM Stability: No instability detected Muscle Tone/Strength: Functionally intact. No obvious neuro-muscular anomalies detected. Sensory (Neurological): Unimpaired Muscle strength & Tone: No palpable anomalies  Lumbar Spine Area Exam  Skin  & Axial Inspection: No masses, redness, or swelling Alignment: Symmetrical Functional ROM: Unrestricted ROM      Stability: No instability detected Muscle Tone/Strength: Functionally intact. No obvious neuro-muscular anomalies detected. Sensory (Neurological): Unimpaired Palpation: No palpable anomalies       Provocative Tests: Lumbar Hyperextension and rotation test: Positive bilaterally for facet joint pain. Lumbar Lateral bending test: Positive due to pain. Patrick's Maneuver: evaluation deferred today                    Gait & Posture Assessment  Ambulation: Unassisted Gait: Relatively normal for age and body habitus Posture: WNL   Lower Extremity Exam    Side: Right lower extremity  Side: Left lower extremity  Skin & Extremity Inspection: Skin color, temperature, and hair growth are WNL. No peripheral edema or cyanosis. No masses, redness, swelling, asymmetry, or associated skin lesions. No contractures.  Skin & Extremity Inspection: Skin color, temperature, and hair growth are WNL. No peripheral edema or cyanosis. No masses, redness, swelling, asymmetry, or associated skin lesions. No contractures.  Functional ROM: Unrestricted ROM          Functional ROM: Unrestricted ROM          Muscle Tone/Strength: Functionally intact. No obvious neuro-muscular anomalies detected.  Muscle Tone/Strength: Functionally intact. No obvious neuro-muscular anomalies detected.  Sensory (Neurological): Unimpaired  Sensory (Neurological): Unimpaired  Palpation: No palpable anomalies  Palpation: No palpable anomalies   Assessment & Plan  Primary Diagnosis & Pertinent Problem List: The primary encounter diagnosis was Lumbar spondylosis. Diagnoses of Lumbar facet arthropathy, Chronic pain syndrome, and Anxiety and depression were also pertinent to this visit.  Visit Diagnosis: 1. Lumbar spondylosis   2. Lumbar facet arthropathy   3. Chronic pain syndrome   4. Anxiety and depression    General  Recommendations: The pain condition that the patient suffers from is best treated with a multidisciplinary approach that involves an increase in physical activity to prevent de-conditioning and worsening of the pain cycle, as well as psychological counseling (formal and/or informal) to address the co-morbid psychological affects of pain. Treatment will often involve judicious use of pain medications and interventional procedures to decrease the pain, allowing the patient to participate in the physical activity that will ultimately produce long-lasting pain reductions. The goal of the multidisciplinary approach is to return the patient to a higher level of overall function and to restore their ability to perform activities of daily living.  56 year old female with a history of axial low back pain left greater than right with radiation into the left leg. Back pain is chronic in nature and could be secondary to L5-S1 disc bulge causing foraminal and lateral recess compression specifically involving the L5 nerve root on the left. Patient also has multilevel lumbar degenerative disc disease and  facet arthropathy, spondylosis that could be explaining her localized axial back pain which is different in quality and nature that her sharp shooting pain, intermittent, and her left leg. We discussed lumbar epidural steroid injections as well as lumbar diagnostic facet blocks with goal radiofrequency ablation. Patient states that she is interested however would like to wait until January when her insurance pricing resets.  Patient has been on many medications in the past which are included below which were not effective including gabapentin, Lyrica, various muscle relaxants, NSAIDs.  Patient continues to be on Lyrica as she states that this provides a little bit more relief than the others without as many side effects including sedation or stomach upset.  Patient has been compliant with our recommendations of seeing a  pain psychologist prior to continuation of opioid therapy at our clinic.  Her urine toxicology screens have also been appropriate.  Today we will start hydrocodone as below.  I will provide her a prescription for 1 month.  She will follow-up with me in January at which point we will discuss lumbar epidural steroid injection (left L5-S1) versus lumbar facet medial branch nerve block.   1.  Signed opioid contract 2.  Prescription for hydrocodone 10 mg 3 times daily as needed as below.  Prescription for 1 month. 3.  Continue Lyrica as prescribed.  75 mg 3 times daily. 4.  Follow-up in 1 month to discuss left L5-S1 ESI versus left lumbar facet blocks.  Also medication management.    Plan of Care  Pharmacotherapy (Medications Ordered): Meds ordered this encounter  Medications  . HYDROcodone-acetaminophen (NORCO) 10-325 MG tablet    Sig: Take 1 tablet by mouth 3 (three) times daily as needed. For chronic pain To last for 30 days from fill date.    Dispense:  90 tablet    Refill:  0   Lab-work, procedure(s), and/or referral(s): No orders of the defined types were placed in this encounter.   Pharmacological management options:  Opioid Analgesics: We'll take over management today. See above orders Membrane stabilizer: Currently on an appropriate regimen Muscle relaxant: Tried and failed NSAID: Options discussed, Ms. Arroyo would prefer avoiding them Other analgesic(s): To be determined at a later time   Interventional management options: Considering:   -Left L5-S1 ESI versus left lumbar facets.   Time Note: Greater than 50% of the 25 minute(s) of face-to-face time spent with Ms. Dayton, was spent in counseling/coordination of care regarding: the appropriate use of the pain scale, the treatment plan, medication side effects and the opioid analgesic risks and possible complications. Provider-requested follow-up: Return in about 4 weeks (around 07/20/2017) for Medication Management.  Future  Appointments  Date Time Provider [place]  07/19/2017 11:30 AM Gillis Santa, MD [facility] None    Primary Care Physician: Idelle Crouch, MD Location: [facility] Note by: Gillis Santa, M.D Date: 06/22/2017; Time: 9:39 AM  Patient Instructions  Ou have been give 1 script for Vicodin today.

## 2017-06-22 NOTE — Patient Instructions (Signed)
Ou have been give 1 script for Vicodin today.

## 2017-07-19 ENCOUNTER — Encounter: Payer: BC Managed Care – PPO | Admitting: Student in an Organized Health Care Education/Training Program

## 2017-07-26 ENCOUNTER — Other Ambulatory Visit: Payer: Self-pay

## 2017-07-26 ENCOUNTER — Encounter: Payer: Self-pay | Admitting: Student in an Organized Health Care Education/Training Program

## 2017-07-26 ENCOUNTER — Ambulatory Visit
Payer: BC Managed Care – PPO | Attending: Student in an Organized Health Care Education/Training Program | Admitting: Student in an Organized Health Care Education/Training Program

## 2017-07-26 VITALS — BP 135/79 | HR 70 | Temp 98.0°F | Resp 16 | Ht 68.0 in | Wt 209.0 lb

## 2017-07-26 DIAGNOSIS — M2578 Osteophyte, vertebrae: Secondary | ICD-10-CM | POA: Insufficient documentation

## 2017-07-26 DIAGNOSIS — Z87891 Personal history of nicotine dependence: Secondary | ICD-10-CM | POA: Insufficient documentation

## 2017-07-26 DIAGNOSIS — I10 Essential (primary) hypertension: Secondary | ICD-10-CM | POA: Diagnosis not present

## 2017-07-26 DIAGNOSIS — Z79899 Other long term (current) drug therapy: Secondary | ICD-10-CM | POA: Insufficient documentation

## 2017-07-26 DIAGNOSIS — F1120 Opioid dependence, uncomplicated: Secondary | ICD-10-CM | POA: Insufficient documentation

## 2017-07-26 DIAGNOSIS — F329 Major depressive disorder, single episode, unspecified: Secondary | ICD-10-CM | POA: Diagnosis not present

## 2017-07-26 DIAGNOSIS — M5136 Other intervertebral disc degeneration, lumbar region: Secondary | ICD-10-CM | POA: Diagnosis not present

## 2017-07-26 DIAGNOSIS — M47816 Spondylosis without myelopathy or radiculopathy, lumbar region: Secondary | ICD-10-CM | POA: Diagnosis not present

## 2017-07-26 DIAGNOSIS — F419 Anxiety disorder, unspecified: Secondary | ICD-10-CM | POA: Insufficient documentation

## 2017-07-26 DIAGNOSIS — E0500 Thyrotoxicosis with diffuse goiter without thyrotoxic crisis or storm: Secondary | ICD-10-CM | POA: Diagnosis not present

## 2017-07-26 DIAGNOSIS — G894 Chronic pain syndrome: Secondary | ICD-10-CM | POA: Diagnosis not present

## 2017-07-26 DIAGNOSIS — Z8639 Personal history of other endocrine, nutritional and metabolic disease: Secondary | ICD-10-CM | POA: Diagnosis not present

## 2017-07-26 DIAGNOSIS — M545 Low back pain: Secondary | ICD-10-CM | POA: Diagnosis present

## 2017-07-26 MED ORDER — HYDROCODONE-ACETAMINOPHEN 10-325 MG PO TABS: 1.0000 | ORAL_TABLET | Freq: Three times a day (TID) | ORAL | 0 refills | Status: DC | PRN

## 2017-07-26 NOTE — Progress Notes (Signed)
Patient's Name: Madeline Zimmerman  MRN: [id number]  Referring Provider: Idelle Crouch, MD  DOB: 03-13-1961  PCP: Idelle Crouch, MD  DOS: 07/26/2017  Note by: Gillis Santa, MD  Service setting: Ambulatory outpatient  Specialty: Interventional Pain Management  Location: [facility] (AMB) Pain Management Facility    Patient type: Established   Primary Reason(s) for Visit: Encounter for prescription drug management. (Level of risk: moderate)  CC: Back Pain (lower)  HPI  Madeline Zimmerman is a 57 y.o. year old, female patient, who comes today for a medication management evaluation. She has Anxiety and depression; Benign hypertension; Chronic pain; Degeneration of intervertebral disc of lumbar region; Personal history of other endocrine, nutritional and metabolic disease; and Opioid dependence ([place]) on their problem list. Her primarily concern today is the Back Pain (lower)  Pain Assessment: Location: Lower Back Radiating: down buttock down side of leg then around front of leg to the top of foot Onset: More than a month ago Duration: Neuropathic pain Quality: Burning, Aching, Constant Severity: 4 /10 (self-reported pain score)  Note: Reported level is compatible with observation.                         When using our objective Pain Scale, levels between 6 and 10/10 are said to belong in an emergency room, as it progressively worsens from a 6/10, described as severely limiting, requiring emergency care not usually available at an outpatient pain management facility. At a 6/10 level, communication becomes difficult and requires great effort. Assistance to reach the emergency department may be required. Facial flushing and profuse sweating along with potentially dangerous increases in heart rate and blood pressure will be evident. Effect on ADL: worse in am, not as active, bending, pulling, pushing, climbing Timing: Constant Modifying factors: rest, streching, meds  Madeline Zimmerman was last scheduled for an appointment on  06/22/2017 for medication management. During today's appointment we reviewed Ms. Chargois's chronic pain status, as well as her outpatient medication regimen.  Patient presents today for follow-up.  States that she is overall doing well.  She is taken hydrocodone 10 mg 2-3 times a day as needed for severe pain.  She takes Lyrica 75 mg nightly.  Otherwise she is interested in pursuing lumbar facet blocks as we discussed in the past however wants to discuss approval and payment with her insurance company.  The patient  reports that she does not use drugs. Her body mass index is 31.78 kg/m.  Further details on both, my assessment(s), as well as the proposed treatment plan, please see below.  Controlled Substance Pharmacotherapy Assessment REMS (Risk Evaluation and Mitigation Strategy)  Analgesic: Hydrocodone 10 mg 3 times daily as needed, quantity 57-monthMME/day: 30 mg/day.  GIgnatius Specking RN  07/26/2017 11:44 AM  Sign at close encounter Nursing Pain Medication Assessment:  Safety precautions to be maintained throughout the outpatient stay will include: orient to surroundings, keep bed in low position, maintain call bell within reach at all times, provide assistance with transfer out of bed and ambulation.  Medication Inspection Compliance: Pill count conducted under aseptic conditions, in front of the patient. Neither the pills nor the bottle was removed from the patient's sight at any time. Once count was completed pills were immediately returned to the patient in their original bottle.  Medication: See above Pill/Patch Count: 0 of 90 pills remain Pill/Patch Appearance: Markings consistent with prescribed medication Bottle Appearance: Standard pharmacy container. Clearly labeled. Filled Date: 184/  12 / 2018 Last Medication intake:  Ran out of medicine more than 48 hours ago   Pharmacokinetics: Liberation and absorption (onset of action): WNL Distribution (time to peak effect):  WNL Metabolism and excretion (duration of action): WNL         Pharmacodynamics: Desired effects: Analgesia: Ms. Alan reports >50% benefit. Functional ability: Patient reports that medication allows her to accomplish basic ADLs Clinically meaningful improvement in function (CMIF): Sustained CMIF goals met Perceived effectiveness: Described as relatively effective, allowing for increase in activities of daily living (ADL) Undesirable effects: Side-effects or Adverse reactions: None reported Monitoring: [place] PMP: Online review of the past 9-monthperiod conducted. Unreported sources of opioid analgesics detected patient did obtain tramadol [place], 2017 [place]  I explained to her that it is against clinic policy and her opioid contract with our clinic to have opioids prescribed for anything other than acute pain. Last UDS on record: Summary  Date Value Ref Range Status  06/13/2017 FINAL  Final    Comment:    ==================================================================== TOXASSURE COMP DRUG ANALYSIS,UR ==================================================================== Test                             Result       Flag       Units Drug Present and Declared for Prescription Verification   Pregabalin                     PRESENT      EXPECTED Drug Present not Declared for Prescription Verification   Oxymorphone                    94           UNEXPECTED ng/mg creat   Noroxycodone                   574          UNEXPECTED ng/mg creat    Oxymorphone and noroxycodone are expected metabolites of    oxycodone. Sources of oxycodone are scheduled prescription    medications. Oxymorphone is also available as a scheduled    prescription medication.   Acetaminophen                  PRESENT      UNEXPECTED Drug Absent but Declared for Prescription Verification   Lorazepam                      Not Detected UNEXPECTED ng/mg creat   Tramadol                        Not Detected UNEXPECTED ng/mg creat   Paroxetine                     Not Detected UNEXPECTED   Ibuprofen                      Not Detected UNEXPECTED    Ibuprofen, as indicated in the declared medication list, is not    always detected even when used as directed. ==================================================================== Test                      Result    Flag   Units      Ref Range   Creatinine  94               mg/dL      >=20 ==================================================================== Declared Medications:  The flagging and interpretation on this report are based on the  following declared medications.  Unexpected results may arise from  inaccuracies in the declared medications.  **Note: The testing scope of this panel includes these medications:  Lorazepam  Paroxetine  Pregabalin (Lyrica)  Tramadol  **Note: The testing scope of this panel does not include small to  moderate amounts of these reported medications:  Ibuprofen  **Note: The testing scope of this panel does not include following  reported medications:  Bisoprolol  Estradiol  Fluticasone  Hydrochlorothiazide  Meloxicam  Norethindrone ==================================================================== For clinical consultation, please call [phone]. ====================================================================    UDS interpretation: Compliant          Medication Assessment Form: Reviewed. Patient indicates being compliant with therapy Treatment compliance: Compliant Risk Assessment Profile: Aberrant behavior: See prior evaluations. None observed or detected today Comorbid factors increasing risk of overdose: See prior notes. No additional risks detected today Risk of substance use disorder (SUD): Low Opioid Risk Tool - 07/26/17 1142      Family History of Substance Abuse   Alcohol  Negative    Illegal Drugs  Negative    Rx Drugs  Negative      Personal  History of Substance Abuse   Alcohol  Negative    Illegal Drugs  Negative    Rx Drugs  Negative      Total Score   Opioid Risk Tool Scoring  0    Opioid Risk Interpretation  Low Risk      ORT Scoring interpretation table:  Score <3 = Low Risk for SUD  Score between 4-7 = Moderate Risk for SUD  Score >8 = High Risk for Opioid Abuse   Risk Mitigation Strategies:  Patient Counseling: Covered Patient-Prescriber Agreement (PPA): Present and active  Notification to other healthcare providers: Done  Pharmacologic Plan: No change in therapy, at this time.             Laboratory Chemistry  Inflammation Markers (CRP: Acute Phase) (ESR: Chronic Phase) No results found for: CRP, ESRSEDRATE, LATICACIDVEN               Rheumatology Markers No results found for: RF, ANA, LABURIC, URICUR, LYMEIGGIGMAB, LYMEABIGMQN              Renal Function Markers No results found for: BUN, CREATININE, GFRAA, GFRNONAA               Hepatic Function Markers No results found for: AST, ALT, ALBUMIN, ALKPHOS, HCVAB, AMYLASE, LIPASE, AMMONIA               Electrolytes No results found for: NA, K, CL, CALCIUM, MG, PHOS               Neuropathy Markers No results found for: VITAMINB12, FOLATE, HGBA1C, HIV               Bone Pathology Markers No results found for: VD25OH, [id number], [id number], [id number], 25OHVITD1, 25OHVITD2, 25OHVITD3, TESTOFREE, TESTOSTERONE               Coagulation Parameters Lab Results  Component Value Date   PLT 385 06/12/2015                 Cardiovascular Markers Lab Results  Component Value Date   HGB 16.2 (H) 06/12/2015   HCT 49.0 (H) 06/12/2015  CA Markers No results found for: CEA, CA125, LABCA2               Note: Lab results reviewed.  Recent Diagnostic Imaging Results  MR Lumbar Spine Wo Contrast CLINICAL DATA:  57 year old female with low back pain for 25 years worse in the past 3 months. Radiates to left leg and both hips. Initial  encounter.  EXAM: MRI LUMBAR SPINE WITHOUT CONTRAST  TECHNIQUE: Multiplanar, multisequence MR imaging of the lumbar spine was performed. No intravenous contrast was administered.  COMPARISON:  05/09/2015 CT abdomen pelvis.  FINDINGS: Segmentation: Last fully open disk space is labeled L5-S1. Present examination incorporates from T11-12 disc space through the lower S2 level.  Alignment:  Curvature of the lumbar spine convex left.  Vertebrae: No worrisome osseous abnormality. Discogenic changes surround the L5-S1 disc space.  Conus medullaris: Extends to the L1 level and appears normal.  Paraspinal and other soft tissues: No worrisome abnormality.  Disc levels:  T11-12: Minimal Schmorl's node deformity inferior endplate T11.  B34-L9:  Minimal Schmorl's node deformity inferior endplate T12.  F7-9: Minimal bulge greater to the right. Minimal facet degenerative changes.  L2-3: Mild to slightly moderate facet degenerative changes. Minimal bulge greater to the right. Minimal narrowing lateral aspect of the thecal sac greater on the right.  L3-4: Prominent facet degenerative changes greater on the right. Ligamentum flavum hypertrophy. Minimal anterior slip L3. Bulge. Slightly short pedicles. Dorsal epidural fat. Multifactorial mild thecal sac narrowing. Minimal right foraminal narrowing.  L4-5: Prominent disc degeneration with disc space narrowing. Bulge and osteophyte with greater extension right foraminal/ lateral position without compression of the exiting right L4 nerve root. Moderate facet degenerative changes greater on right. Ligamentum flavum hypertrophy. Mild narrowing lateral aspect of the thecal sac greater on right with minimal crowding of the contained right L5 nerve root.  L5-S1: Moderate facet degenerative changes. Disc degeneration with endplate reactive changes. Bulge and osteophyte with foraminal/ lateral extension greater on the left. Crowding of the  course the exiting L5 nerve root greater on the left. Mild lateral recess narrowing greater on left.  IMPRESSION: L5-S1 bulge and osteophyte with foraminal/ lateral extension greater on the left. Crowding of the course the exiting L5 nerve root greater on the left. Mild lateral recess narrowing greater on left.  L4-5 bulge and osteophyte with greater extension right foraminal/ lateral position without compression of the exiting right L4 nerve root. Multifactorial mild narrowing lateral aspect of the thecal sac greater on right with minimal crowding of the contained right L5 nerve root.  L3-4 multifactorial mild thecal sac narrowing. Minimal right foraminal narrowing.  L2-3 minimal narrowing lateral aspect of the thecal sac greater on the right.  Facet degenerative changes lower lumbar spine as detailed above.  Electronically Signed   By: Genia Del M.D.   On: 03/23/2016 11:43  Complexity Note: Imaging results reviewed. Results shared with Ms. Henrene Pastor, using Layman's terms.                         Meds   Current Outpatient Medications:  .  bisoprolol-hydrochlorothiazide (ZIAC) 5-6.25 MG tablet, Take 1 tablet by mouth daily., Disp: , Rfl:  .  fluticasone (FLONASE) 50 MCG/ACT nasal spray, Place into the nose., Disp: , Rfl:  .  HYDROcodone-acetaminophen (NORCO) 10-325 MG tablet, Take 1 tablet by mouth 3 (three) times daily as needed. For chronic pain To last for 30 days from fill date. To fill on or after: 07-26-2017,  08-25-2017, Disp: 90 tablet, Rfl: 0 .  ibuprofen (ADVIL) 200 MG tablet, Take 200 mg by mouth., Disp: , Rfl:  .  LYRICA 75 MG capsule, Take 75 mg by mouth 3 (three) times daily., Disp: , Rfl: 5 .  PARoxetine (PAXIL) 20 MG tablet, TAKE 1 TABLET (20 MG TOTAL) BY MOUTH ONCE DAILY., Disp: , Rfl:  .  estradiol (VIVELLE-DOT) 0.1 MG/24HR patch, Place onto the skin., Disp: , Rfl:  .  meloxicam (MOBIC) 7.5 MG tablet, TAKE 1 TABLET BY MOUTH EVERY DAY, Disp: , Rfl:  .   norethindrone (AYGESTIN) 5 MG tablet, Take by mouth., Disp: , Rfl:   ROS  Constitutional: Denies any fever or chills Gastrointestinal: No reported hemesis, hematochezia, vomiting, or acute GI distress Musculoskeletal: Denies any acute onset joint swelling, redness, loss of ROM, or weakness Neurological: No reported episodes of acute onset apraxia, aphasia, dysarthria, agnosia, amnesia, paralysis, loss of coordination, or loss of consciousness  Allergies  Ms. Tool is allergic to bacitracin and neosporin  [neomycin-bacitracin zn-polymyx].  [place]  Drug: Ms. Frosch  reports that she does not use drugs. Alcohol:  reports that she does not drink alcohol. Tobacco:  reports that she has quit smoking. she has never used smokeless tobacco. Medical:  has a past medical history of Anxiety and depression, Chronic pain, DDD (degenerative disc disease), cervical, Graves disease, and HTN (hypertension). Surgical: Ms. Linenberger  has a past surgical history that includes Reduction mammaplasty (2012) and revision breast reduction (2013). Family: family history includes Heart failure in her father; Hypertension in her mother.  Constitutional Exam  General appearance: Well nourished, well developed, and well hydrated. In no apparent acute distress Vitals:   07/26/17 1134  BP: 135/79  Pulse: 70  Resp: 16  Temp: 98 F (36.7 C)  SpO2: 99%  Weight: 209 lb (94.8 kg)  Height: _0  (1.727 m)   BMI Assessment: Estimated body mass index is 31.78 kg/m as calculated from the following:   Height as of this encounter: _1  (1.727 m).   Weight as of this encounter: 209 lb (94.8 kg).  BMI interpretation table: BMI level Category Range association with higher incidence of chronic pain  <18 kg/m2 Underweight   18.5-24.9 kg/m2 Ideal body weight   25-29.9 kg/m2 Overweight Increased incidence by 20%  30-34.9 kg/m2 Obese (Class I) Increased incidence by 68%  35-39.9 kg/m2 Severe obesity (Class II) Increased incidence  by 136%  >40 kg/m2 Extreme obesity (Class III) Increased incidence by 254%   BMI Readings from Last 4 Encounters:  07/26/17 31.78 kg/m  06/22/17 32.23 kg/m  06/13/17 32.54 kg/m  05/05/17 31.17 kg/m   Wt Readings from Last 4 Encounters:  07/26/17 209 lb (94.8 kg)  06/22/17 212 lb (96.2 kg)  06/13/17 214 lb (97.1 kg)  05/05/17 205 lb (93 kg)  Psych/Mental status: Alert, oriented x 3 (person, place, & time)       Eyes: PERLA Respiratory: No evidence of acute respiratory distress  Cervical Spine Area Exam  Skin & Axial Inspection: No masses, redness, edema, swelling, or associated skin lesions Alignment: Symmetrical Functional ROM: Unrestricted ROM      Stability: No instability detected Muscle Tone/Strength: Functionally intact. No obvious neuro-muscular anomalies detected. Sensory (Neurological): Unimpaired Palpation: No palpable anomalies              Upper Extremity (UE) Exam    Side: Right upper extremity  Side: Left upper extremity  Skin & Extremity Inspection: Skin color, temperature, and hair growth are  WNL. No peripheral edema or cyanosis. No masses, redness, swelling, asymmetry, or associated skin lesions. No contractures.  Skin & Extremity Inspection: Skin color, temperature, and hair growth are WNL. No peripheral edema or cyanosis. No masses, redness, swelling, asymmetry, or associated skin lesions. No contractures.  Functional ROM: Unrestricted ROM          Functional ROM: Unrestricted ROM          Muscle Tone/Strength: Functionally intact. No obvious neuro-muscular anomalies detected.  Muscle Tone/Strength: Functionally intact. No obvious neuro-muscular anomalies detected.  Sensory (Neurological): Unimpaired          Sensory (Neurological): Unimpaired          Palpation: No palpable anomalies              Palpation: No palpable anomalies              Specialized Test(s): Deferred         Specialized Test(s): Deferred          Thoracic Spine Area Exam  Skin & Axial  Inspection: No masses, redness, or swelling Alignment: Symmetrical Functional ROM: Unrestricted ROM Stability: No instability detected Muscle Tone/Strength: Functionally intact. No obvious neuro-muscular anomalies detected. Sensory (Neurological): Unimpaired Muscle strength & Tone: No palpable anomalies  Lumbar Spine Area Exam  Skin & Axial Inspection: No masses, redness, or swelling Alignment: Symmetrical Functional ROM: Unrestricted ROM      Stability: No instability detected Muscle Tone/Strength: Functionally intact. No obvious neuro-muscular anomalies detected. Sensory (Neurological): Musculoskeletal pain pattern Palpation: Complains of area being tender to palpation       Provocative Tests: Lumbar Hyperextension and rotation test: Positive bilaterally for facet joint pain. Lumbar Lateral bending test: Positive due to pain. Patrick's Maneuver: evaluation deferred today                    Gait & Posture Assessment  Ambulation: Unassisted Gait: Relatively normal for age and body habitus Posture: WNL   Lower Extremity Exam    Side: Right lower extremity  Side: Left lower extremity  Skin & Extremity Inspection: Skin color, temperature, and hair growth are WNL. No peripheral edema or cyanosis. No masses, redness, swelling, asymmetry, or associated skin lesions. No contractures.  Skin & Extremity Inspection: Skin color, temperature, and hair growth are WNL. No peripheral edema or cyanosis. No masses, redness, swelling, asymmetry, or associated skin lesions. No contractures.  Functional ROM: Unrestricted ROM          Functional ROM: Unrestricted ROM          Muscle Tone/Strength: Functionally intact. No obvious neuro-muscular anomalies detected.  Muscle Tone/Strength: Functionally intact. No obvious neuro-muscular anomalies detected.  Sensory (Neurological): Unimpaired  Sensory (Neurological): Unimpaired  Palpation: No palpable anomalies  Palpation: No palpable anomalies   Assessment    Primary Diagnosis & Pertinent Problem List: The primary encounter diagnosis was Lumbar spondylosis. Diagnoses of Lumbar facet arthropathy, Chronic pain syndrome, and Anxiety and depression were also pertinent to this visit.  Status Diagnosis  Controlled Controlled Controlled 1. Lumbar spondylosis   2. Lumbar facet arthropathy   3. Chronic pain syndrome   4. Anxiety and depression       General Recommendations: The pain condition that the patient suffers from is best treated with a multidisciplinary approach that involves an increase in physical activity to prevent de-conditioning and worsening of the pain cycle, as well as psychological counseling (formal and/or informal) to address the co-morbid psychological affects of pain. Treatment will often  involve judicious use of pain medications and interventional procedures to decrease the pain, allowing the patient to participate in the physical activity that will ultimately produce long-lasting pain reductions. The goal of the multidisciplinary approach is to return the patient to a higher level of overall function and to restore their ability to perform activities of daily living.  57 year old female with a history of axial low back pain left greater than right with radiation into the left leg. Back pain is chronic in nature and could be secondary to L5-S1 disc bulge causing foraminal and lateral recess compression specifically involving the L5 nerve root on the left. Patient also has multilevel lumbar degenerative disc disease and facet arthropathy, spondylosis that could be explaining her localized axial back pain which is different in quality and nature that her sharp shooting pain, intermittent, and her left leg. We discussed lumbar diagnostic facet blocks with goal radiofrequency ablation. Patient states that she is interested however would like to discuss approval and payment with her insurance before scheduling.  She states that insurance has  still not paid for her previous visits and does not want to be stuck with a large bill if they are not able to approve the procedure.  I did have a discussion with the patient about her filling tramadol with another provider.  I reinforced to her that she does have a pain contract with our clinic and that all opioid prescriptions must come from here unless they are for acute pain in which case she needs to notify us that she has been written a prescription.  Patient endorsed understanding.  Otherwise patient continues her Lyrica 75 mg daily and her hydrocodone as prescribed below.  Plan: -Refill hydrocodone as below for 2 months. -Continue Lyrica 75 mg nightly. -PRN order placed for lumbar, L3-L5 facet medial branch nerve blocks pending patient's discussion with her insurance company regarding approval. -UDS at next visit  Plan of Care  Pharmacotherapy (Medications Ordered): Meds ordered this encounter  Medications  . DISCONTD: HYDROcodone-acetaminophen (NORCO) 10-325 MG tablet    Sig: Take 1 tablet by mouth 3 (three) times daily as needed. For chronic pain To last for 30 days from fill date. To fill on or after: 07-26-2017, 08-25-2017    Dispense:  90 tablet    Refill:  0  . HYDROcodone-acetaminophen (NORCO) 10-325 MG tablet    Sig: Take 1 tablet by mouth 3 (three) times daily as needed. For chronic pain To last for 30 days from fill date. To fill on or after: 07-26-2017, 08-25-2017    Dispense:  90 tablet    Refill:  0   Lab-work, procedure(s), and/or referral(s): Orders Placed This Encounter  Procedures  . LUMBAR FACET(MEDIAL BRANCH NERVE BLOCK) [facility]    Provider-requested follow-up: Return in about 8 weeks (around 09/20/2017) for Medication Management. Time Note: Greater than 50% of the 25 minute(s) of face-to-face time spent with Ms. Droll, was spent in counseling/coordination of care regarding: opioid tolerance, the treatment plan, treatment alternatives, the risks and possible  complications of proposed treatment, medication side effects, realistic expectations and the medication agreement.  Future Appointments  Date Time Provider [place]  09/20/2017  9:30 AM Gillis Santa, MD [facility] None    Primary Care Physician: Idelle Crouch, MD Location: [facility] Facility Note by: Gillis Santa, M.D Date: 07/26/2017; Time: 12:15 PM  Patient Instructions   1.  Refill oxycodone for 2 months 2.  Please discuss with your insurance company coverage regarding bilateral lumbar  facet medial branch blocks.  Am as needed order has been placed if you would like to schedule.GENERAL RISKS AND COMPLICATIONS  What are the risk, side effects and possible complications? Generally speaking, most procedures are safe.  However, with any procedure there are risks, side effects, and the possibility of complications.  The risks and complications are dependent upon the sites that are lesioned, or the type of nerve block to be performed.  The closer the procedure is to the spine, the more serious the risks are.  Great care is taken when placing the radio frequency needles, block needles or lesioning probes, but sometimes complications can occur. 1. Infection: Any time there is an injection through the skin, there is a risk of infection.  This is why sterile conditions are used for these blocks.  There are four possible types of infection. 1. Localized skin infection. 2. Central Nervous System Infection-This can be in the form of Meningitis, which can be deadly. 3. Epidural Infections-This can be in the form of an epidural abscess, which can cause pressure inside of the spine, causing compression of the spinal cord with subsequent paralysis. This would require an emergency surgery to decompress, and there are no guarantees that the patient would recover from the paralysis. 4. Discitis-This is an infection of the intervertebral discs.  It occurs in about 1% of  discography procedures.  It is difficult to treat and it may lead to surgery.        2. Pain: the needles have to go through skin and soft tissues, will cause soreness.       3. Damage to internal structures:  The nerves to be lesioned may be near blood vessels or    other nerves which can be potentially damaged.       4. Bleeding: Bleeding is more common if the patient is taking blood thinners such as  aspirin, Coumadin, Ticiid, Plavix, etc., or if he/she have some genetic predisposition  such as hemophilia. Bleeding into the spinal canal can cause compression of the spinal  cord with subsequent paralysis.  This would require an emergency surgery to  decompress and there are no guarantees that the patient would recover from the  paralysis.       5. Pneumothorax:  Puncturing of a lung is a possibility, every time a needle is introduced in  the area of the chest or upper back.  Pneumothorax refers to free air around the  collapsed lung(s), inside of the thoracic cavity (chest cavity).  Another two possible  complications related to a similar event would include: Hemothorax and Chylothorax.   These are variations of the Pneumothorax, where instead of air around the collapsed  lung(s), you may have blood or chyle, respectively.       6. Spinal headaches: They may occur with any procedures in the area of the spine.       7. Persistent CSF (Cerebro-Spinal Fluid) leakage: This is a rare problem, but may occur  with prolonged intrathecal or epidural catheters either due to the formation of a fistulous  track or a dural tear.       8. Nerve damage: By working so close to the spinal cord, there is always a possibility of  nerve damage, which could be as serious as a permanent spinal cord injury with  paralysis.       9. Death:  Although rare, severe deadly allergic reactions known as "Anaphylactic  reaction" can occur to any of the medications used.  10. Worsening of the symptoms:  We can always make thing  worse.  What are the chances of something like this happening? Chances of any of this occuring are extremely low.  By statistics, you have more of a chance of getting killed in a motor vehicle accident: while driving to the hospital than any of the above occurring .  Nevertheless, you should be aware that they are possibilities.  In general, it is similar to taking a shower.  Everybody knows that you can slip, hit your head and get killed.  Does that mean that you should not shower again?  Nevertheless always keep in mind that statistics do not mean anything if you happen to be on the wrong side of them.  Even if a procedure has a 1 (one) in a 1,000,000 (million) chance of going wrong, it you happen to be that one..Also, keep in mind that by statistics, you have more of a chance of having something go wrong when taking medications.  Who should not have this procedure? If you are on a blood thinning medication (e.g. Coumadin, Plavix, see list of "Blood Thinners"), or if you have an active infection going on, you should not have the procedure.  If you are taking any blood thinners, please inform your physician.  How should I prepare for this procedure?  Do not eat or drink anything at least six hours prior to the procedure.  Bring a driver with you .  It cannot be a taxi.  Come accompanied by an adult that can drive you back, and that is strong enough to help you if your legs get weak or numb from the local anesthetic.  Take all of your medicines the morning of the procedure with just enough water to swallow them.  If you have diabetes, make sure that you are scheduled to have your procedure done first thing in the morning, whenever possible.  If you have diabetes, take only half of your insulin dose and notify our nurse that you have done so as soon as you arrive at the clinic.  If you are diabetic, but only take blood sugar pills (oral hypoglycemic), then do not take them on the morning of your  procedure.  You may take them after you have had the procedure.  Do not take aspirin or any aspirin-containing medications, at least eleven (11) days prior to the procedure.  They may prolong bleeding.  Wear loose fitting clothing that may be easy to take off and that you would not mind if it got stained with Betadine or blood.  Do not wear any jewelry or perfume  Remove any nail coloring.  It will interfere with some of our monitoring equipment.  NOTE: Remember that this is not meant to be interpreted as a complete list of all possible complications.  Unforeseen problems may occur.  BLOOD THINNERS The following drugs contain aspirin or other products, which can cause increased bleeding during surgery and should not be taken for 2 weeks prior to and 1 week after surgery.  If you should need take something for relief of minor pain, you may take acetaminophen which is found in Tylenol,m Datril, Anacin-3 and Panadol. It is not blood thinner. The products listed below are.  Do not take any of the products listed below in addition to any listed on your instruction sheet.  A.P.C or A.P.C with Codeine Codeine Phosphate Capsules #3 Ibuprofen Ridaura  ABC compound Congesprin Imuran rimadil  Advil Cope Indocin Robaxisal  Alka-Seltzer Effervescent Pain Reliever and Antacid Coricidin or Coricidin-D  Indomethacin Rufen  Alka-Seltzer plus Cold Medicine Cosprin Ketoprofen S-A-C Tablets  Anacin Analgesic Tablets or Capsules Coumadin Korlgesic Salflex  Anacin Extra Strength Analgesic tablets or capsules CP-2 Tablets Lanoril Salicylate  Anaprox Cuprimine Capsules Levenox Salocol  Anexsia-D Dalteparin Magan Salsalate  Anodynos Darvon compound Magnesium Salicylate Sine-off  Ansaid Dasin Capsules Magsal Sodium Salicylate  Anturane Depen Capsules Marnal Soma  APF Arthritis pain formula Dewitt's Pills Measurin Stanback  Argesic Dia-Gesic Meclofenamic Sulfinpyrazone  Arthritis Bayer Timed Release Aspirin  Diclofenac Meclomen Sulindac  Arthritis pain formula Anacin Dicumarol Medipren Supac  Analgesic (Safety coated) Arthralgen Diffunasal Mefanamic Suprofen  Arthritis Strength Bufferin Dihydrocodeine Mepro Compound Suprol  Arthropan liquid Dopirydamole Methcarbomol with Aspirin Synalgos  ASA tablets/Enseals Disalcid Micrainin Tagament  Ascriptin Doan's Midol Talwin  Ascriptin A/D Dolene Mobidin Tanderil  Ascriptin Extra Strength Dolobid Moblgesic Ticlid  Ascriptin with Codeine Doloprin or Doloprin with Codeine Momentum Tolectin  Asperbuf Duoprin Mono-gesic Trendar  Aspergum Duradyne Motrin or Motrin IB Triminicin  Aspirin plain, buffered or enteric coated Durasal Myochrisine Trigesic  Aspirin Suppositories Easprin Nalfon Trillsate  Aspirin with Codeine Ecotrin Regular or Extra Strength Naprosyn Uracel  Atromid-S Efficin Naproxen Ursinus  Auranofin Capsules Elmiron Neocylate Vanquish  Axotal Emagrin Norgesic Verin  Azathioprine Empirin or Empirin with Codeine Normiflo Vitamin E  Azolid Emprazil Nuprin Voltaren  Bayer Aspirin plain, buffered or children's or timed BC Tablets or powders Encaprin Orgaran Warfarin Sodium  Buff-a-Comp Enoxaparin Orudis Zorpin  Buff-a-Comp with Codeine Equegesic Os-Cal-Gesic   Buffaprin Excedrin plain, buffered or Extra Strength Oxalid   Bufferin Arthritis Strength Feldene Oxphenbutazone   Bufferin plain or Extra Strength Feldene Capsules Oxycodone with Aspirin   Bufferin with Codeine Fenoprofen Fenoprofen Pabalate or Pabalate-SF   Buffets II Flogesic Panagesic   Buffinol plain or Extra Strength Florinal or Florinal with Codeine Panwarfarin   Buf-Tabs Flurbiprofen Penicillamine   Butalbital Compound Four-way cold tablets Penicillin   Butazolidin Fragmin Pepto-Bismol   Carbenicillin Geminisyn Percodan   Carna Arthritis Reliever Geopen Persantine   Carprofen Gold's salt Persistin   Chloramphenicol Goody's Phenylbutazone   Chloromycetin Haltrain Piroxlcam     Clmetidine heparin Plaquenil   Cllnoril Hyco-pap Ponstel   Clofibrate Hydroxy chloroquine Propoxyphen         Before stopping any of these medications, be sure to consult the physician who ordered them.  Some, such as Coumadin (Warfarin) are ordered to prevent or treat serious conditions such as "deep thrombosis", "pumonary embolisms", and other heart problems.  The amount of time that you may need off of the medication may also vary with the medication and the reason for which you were taking it.  If you are taking any of these medications, please make sure you notify your pain physician before you undergo any procedures.          Facet Joint Block The facet joints connect the bones of the spine (vertebrae). They make it possible for you to bend, twist, and make other movements with your spine. They also keep you from bending too far, twisting too far, and making other excessive movements. A facet joint block is a procedure where a numbing medicine (anesthetic) is injected into a facet joint. Often, a type of anti-inflammatory medicine called a steroid is also injected. A facet joint block may be done to diagnose neck or back pain. If the pain gets better after a facet joint block, it means the pain is probably coming from the facet joint.  If the pain does not get better, it means the pain is probably not coming from the facet joint. A facet joint block may also be done to relieve neck or back pain caused by an inflamed facet joint. A facet joint block is only done to relieve pain if the pain does not improve with other methods, such as medicine, exercise programs, and physical therapy. Tell a health care provider about:  Any allergies you have.  All medicines you are taking, including vitamins, herbs, eye drops, creams, and over-the-counter medicines.  Any problems you or family members have had with anesthetic medicines.  Any blood disorders you have.  Any surgeries you have  had.  Any medical conditions you have.  Whether you are pregnant or may be pregnant. What are the risks? Generally, this is a safe procedure. However, problems may occur, including:  Bleeding.  Injury to a nerve near the injection site.  Pain at the injection site.  Weakness or numbness in areas controlled by nerves near the injection site.  Infection.  Temporary fluid retention.  Allergic reactions to medicines or dyes.  Injury to other structures or organs near the injection site.  What happens before the procedure?  Follow instructions from your health care provider about eating or drinking restrictions.  Ask your health care provider about: ? Changing or stopping your regular medicines. This is especially important if you are taking diabetes medicines or blood thinners. ? Taking medicines such as aspirin and ibuprofen. These medicines can thin your blood. Do not take these medicines before your procedure if your health care provider instructs you not to.  Do not take any new dietary supplements or medicines without asking your health care provider first.  Plan to have someone take you home after the procedure. What happens during the procedure?  You may need to remove your clothing and dress in an open-back gown.  The procedure will be done while you are lying on an X-ray table. You will most likely be asked to lie on your stomach, but you may be asked to lie in a different position if an injection will be made in your neck.  Machines will be used to monitor your oxygen levels, heart rate, and blood pressure.  If an injection will be made in your neck, an IV tube will be inserted into one of your veins. Fluids and medicine will flow directly into your body through the IV tube.  The area over the facet joint where the injection will be made will be cleaned with soap. The surrounding skin will be covered with clean drapes.  A numbing medicine (local anesthetic) will  be applied to your skin. Your skin may sting or burn for a moment.  A video X-ray machine (fluoroscopy) will be used to locate the joint. In some cases, a CT scan may be used.  A contrast dye may be injected into the facet joint area to help locate the joint.  When the joint is located, an anesthetic will be injected into the joint through the needle.  Your health care provider will ask you whether you feel pain relief. If you do feel relief, a steroid may be injected to provide pain relief for a longer period of time. If you do not feel relief or feel only partial relief, additional injections of an anesthetic may be made in other facet joints.  The needle will be removed.  Your skin will be cleaned.  A bandage (dressing) will be applied over  each injection site. The procedure may vary among health care providers and hospitals. What happens after the procedure?  You will be observed for 15-30 minutes before being allowed to go home. This information is not intended to replace advice given to you by your health care provider. Make sure you discuss any questions you have with your health care provider. Document Released: 11/17/2006 Document Revised: 07/30/2015 Document Reviewed: 03/24/2015 [employer]vier Interactive Patient Education  [employer].

## 2017-07-26 NOTE — Progress Notes (Signed)
Nursing Pain Medication Assessment:  Safety precautions to be maintained throughout the outpatient stay will include: orient to surroundings, keep bed in low position, maintain call bell within reach at all times, provide assistance with transfer out of bed and ambulation.  Medication Inspection Compliance: Pill count conducted under aseptic conditions, in front of the patient. Neither the pills nor the bottle was removed from the patient's sight at any time. Once count was completed pills were immediately returned to the patient in their original bottle.  Medication: See above Pill/Patch Count: 0 of 90 pills remain Pill/Patch Appearance: Markings consistent with prescribed medication Bottle Appearance: Standard pharmacy container. Clearly labeled. Filled Date: 1612 / 12 / 2018 Last Medication intake:  Ran out of medicine more than 48 hours ago

## 2017-07-26 NOTE — Patient Instructions (Addendum)
1.  Refill oxycodone for 2 months 2.  Please discuss with your insurance company coverage regarding bilateral lumbar facet medial branch blocks.  An as needed order has been placed if you would like to schedule.GENERAL RISKS AND COMPLICATIONS  What are the risk, side effects and possible complications? Generally speaking, most procedures are safe.  However, with any procedure there are risks, side effects, and the possibility of complications.  The risks and complications are dependent upon the sites that are lesioned, or the type of nerve block to be performed.  The closer the procedure is to the spine, the more serious the risks are.  Great care is taken when placing the radio frequency needles, block needles or lesioning probes, but sometimes complications can occur. 1. Infection: Any time there is an injection through the skin, there is a risk of infection.  This is why sterile conditions are used for these blocks.  There are four possible types of infection. 1. Localized skin infection. 2. Central Nervous System Infection-This can be in the form of Meningitis, which can be deadly. 3. Epidural Infections-This can be in the form of an epidural abscess, which can cause pressure inside of the spine, causing compression of the spinal cord with subsequent paralysis. This would require an emergency surgery to decompress, and there are no guarantees that the patient would recover from the paralysis. 4. Discitis-This is an infection of the intervertebral discs.  It occurs in about 1% of discography procedures.  It is difficult to treat and it may lead to surgery.        2. Pain: the needles have to go through skin and soft tissues, will cause soreness.       3. Damage to internal structures:  The nerves to be lesioned may be near blood vessels or    other nerves which can be potentially damaged.       4. Bleeding: Bleeding is more common if the patient is taking blood thinners such as  aspirin, Coumadin,  Ticiid, Plavix, etc., or if he/she have some genetic predisposition  such as hemophilia. Bleeding into the spinal canal can cause compression of the spinal  cord with subsequent paralysis.  This would require an emergency surgery to  decompress and there are no guarantees that the patient would recover from the  paralysis.       5. Pneumothorax:  Puncturing of a lung is a possibility, every time a needle is introduced in  the area of the chest or upper back.  Pneumothorax refers to free air around the  collapsed lung(s), inside of the thoracic cavity (chest cavity).  Another two possible  complications related to a similar event would include: Hemothorax and Chylothorax.   These are variations of the Pneumothorax, where instead of air around the collapsed  lung(s), you may have blood or chyle, respectively.       6. Spinal headaches: They may occur with any procedures in the area of the spine.       7. Persistent CSF (Cerebro-Spinal Fluid) leakage: This is a rare problem, but may occur  with prolonged intrathecal or epidural catheters either due to the formation of a fistulous  track or a dural tear.       8. Nerve damage: By working so close to the spinal cord, there is always a possibility of  nerve damage, which could be as serious as a permanent spinal cord injury with  paralysis.       9. Death:  Although rare, severe deadly allergic reactions known as "Anaphylactic  reaction" can occur to any of the medications used.      10. Worsening of the symptoms:  We can always make thing worse.  What are the chances of something like this happening? Chances of any of this occuring are extremely low.  By statistics, you have more of a chance of getting killed in a motor vehicle accident: while driving to the hospital than any of the above occurring .  Nevertheless, you should be aware that they are possibilities.  In general, it is similar to taking a shower.  Everybody knows that you can slip, hit your head and  get killed.  Does that mean that you should not shower again?  Nevertheless always keep in mind that statistics do not mean anything if you happen to be on the wrong side of them.  Even if a procedure has a 1 (one) in a 1,000,000 (million) chance of going wrong, it you happen to be that one..Also, keep in mind that by statistics, you have more of a chance of having something go wrong when taking medications.  Who should not have this procedure? If you are on a blood thinning medication (e.g. Coumadin, Plavix, see list of "Blood Thinners"), or if you have an active infection going on, you should not have the procedure.  If you are taking any blood thinners, please inform your physician.  How should I prepare for this procedure?  Do not eat or drink anything at least six hours prior to the procedure.  Bring a driver with you .  It cannot be a taxi.  Come accompanied by an adult that can drive you back, and that is strong enough to help you if your legs get weak or numb from the local anesthetic.  Take all of your medicines the morning of the procedure with just enough water to swallow them.  If you have diabetes, make sure that you are scheduled to have your procedure done first thing in the morning, whenever possible.  If you have diabetes, take only half of your insulin dose and notify our nurse that you have done so as soon as you arrive at the clinic.  If you are diabetic, but only take blood sugar pills (oral hypoglycemic), then do not take them on the morning of your procedure.  You may take them after you have had the procedure.  Do not take aspirin or any aspirin-containing medications, at least eleven (11) days prior to the procedure.  They may prolong bleeding.  Wear loose fitting clothing that may be easy to take off and that you would not mind if it got stained with Betadine or blood.  Do not wear any jewelry or perfume  Remove any nail coloring.  It will interfere with some of  our monitoring equipment.  NOTE: Remember that this is not meant to be interpreted as a complete list of all possible complications.  Unforeseen problems may occur.  BLOOD THINNERS The following drugs contain aspirin or other products, which can cause increased bleeding during surgery and should not be taken for 2 weeks prior to and 1 week after surgery.  If you should need take something for relief of minor pain, you may take acetaminophen which is found in Tylenol,m Datril, Anacin-3 and Panadol. It is not blood thinner. The products listed below are.  Do not take any of the products listed below in addition to any listed on your instruction sheet.  A.P.C or A.P.C with Codeine Codeine Phosphate Capsules #3 Ibuprofen Ridaura  ABC compound Congesprin Imuran rimadil  Advil Cope Indocin Robaxisal  Alka-Seltzer Effervescent Pain Reliever and Antacid Coricidin or Coricidin-D  Indomethacin Rufen  Alka-Seltzer plus Cold Medicine Cosprin Ketoprofen S-A-C Tablets  Anacin Analgesic Tablets or Capsules Coumadin Korlgesic Salflex  Anacin Extra Strength Analgesic tablets or capsules CP-2 Tablets Lanoril Salicylate  Anaprox Cuprimine Capsules Levenox Salocol  Anexsia-D Dalteparin Magan Salsalate  Anodynos Darvon compound Magnesium Salicylate Sine-off  Ansaid Dasin Capsules Magsal Sodium Salicylate  Anturane Depen Capsules Marnal Soma  APF Arthritis pain formula Dewitt's Pills Measurin Stanback  Argesic Dia-Gesic Meclofenamic Sulfinpyrazone  Arthritis Bayer Timed Release Aspirin Diclofenac Meclomen Sulindac  Arthritis pain formula Anacin Dicumarol Medipren Supac  Analgesic (Safety coated) Arthralgen Diffunasal Mefanamic Suprofen  Arthritis Strength Bufferin Dihydrocodeine Mepro Compound Suprol  Arthropan liquid Dopirydamole Methcarbomol with Aspirin Synalgos  ASA tablets/Enseals Disalcid Micrainin Tagament  Ascriptin Doan's Midol Talwin  Ascriptin A/D Dolene Mobidin Tanderil  Ascriptin Extra Strength  Dolobid Moblgesic Ticlid  Ascriptin with Codeine Doloprin or Doloprin with Codeine Momentum Tolectin  Asperbuf Duoprin Mono-gesic Trendar  Aspergum Duradyne Motrin or Motrin IB Triminicin  Aspirin plain, buffered or enteric coated Durasal Myochrisine Trigesic  Aspirin Suppositories Easprin Nalfon Trillsate  Aspirin with Codeine Ecotrin Regular or Extra Strength Naprosyn Uracel  Atromid-S Efficin Naproxen Ursinus  Auranofin Capsules Elmiron Neocylate Vanquish  Axotal Emagrin Norgesic Verin  Azathioprine Empirin or Empirin with Codeine Normiflo Vitamin E  Azolid Emprazil Nuprin Voltaren  Bayer Aspirin plain, buffered or children's or timed BC Tablets or powders Encaprin Orgaran Warfarin Sodium  Buff-a-Comp Enoxaparin Orudis Zorpin  Buff-a-Comp with Codeine Equegesic Os-Cal-Gesic   Buffaprin Excedrin plain, buffered or Extra Strength Oxalid   Bufferin Arthritis Strength Feldene Oxphenbutazone   Bufferin plain or Extra Strength Feldene Capsules Oxycodone with Aspirin   Bufferin with Codeine Fenoprofen Fenoprofen Pabalate or Pabalate-SF   Buffets II Flogesic Panagesic   Buffinol plain or Extra Strength Florinal or Florinal with Codeine Panwarfarin   Buf-Tabs Flurbiprofen Penicillamine   Butalbital Compound Four-way cold tablets Penicillin   Butazolidin Fragmin Pepto-Bismol   Carbenicillin Geminisyn Percodan   Carna Arthritis Reliever Geopen Persantine   Carprofen Gold's salt Persistin   Chloramphenicol Goody's Phenylbutazone   Chloromycetin Haltrain Piroxlcam   Clmetidine heparin Plaquenil   Cllnoril Hyco-pap Ponstel   Clofibrate Hydroxy chloroquine Propoxyphen         Before stopping any of these medications, be sure to consult the physician who ordered them.  Some, such as Coumadin (Warfarin) are ordered to prevent or treat serious conditions such as "deep thrombosis", "pumonary embolisms", and other heart problems.  The amount of time that you may need off of the medication may also  vary with the medication and the reason for which you were taking it.  If you are taking any of these medications, please make sure you notify your pain physician before you undergo any procedures.          Facet Joint Block The facet joints connect the bones of the spine (vertebrae). They make it possible for you to bend, twist, and make other movements with your spine. They also keep you from bending too far, twisting too far, and making other excessive movements. A facet joint block is a procedure where a numbing medicine (anesthetic) is injected into a facet joint. Often, a type of anti-inflammatory medicine called a steroid is also injected. A facet joint block may be done to diagnose neck or back  pain. If the pain gets better after a facet joint block, it means the pain is probably coming from the facet joint. If the pain does not get better, it means the pain is probably not coming from the facet joint. A facet joint block may also be done to relieve neck or back pain caused by an inflamed facet joint. A facet joint block is only done to relieve pain if the pain does not improve with other methods, such as medicine, exercise programs, and physical therapy. Tell a health care provider about:  Any allergies you have.  All medicines you are taking, including vitamins, herbs, eye drops, creams, and over-the-counter medicines.  Any problems you or family members have had with anesthetic medicines.  Any blood disorders you have.  Any surgeries you have had.  Any medical conditions you have.  Whether you are pregnant or may be pregnant. What are the risks? Generally, this is a safe procedure. However, problems may occur, including:  Bleeding.  Injury to a nerve near the injection site.  Pain at the injection site.  Weakness or numbness in areas controlled by nerves near the injection site.  Infection.  Temporary fluid retention.  Allergic reactions to medicines or  dyes.  Injury to other structures or organs near the injection site.  What happens before the procedure?  Follow instructions from your health care provider about eating or drinking restrictions.  Ask your health care provider about: ? Changing or stopping your regular medicines. This is especially important if you are taking diabetes medicines or blood thinners. ? Taking medicines such as aspirin and ibuprofen. These medicines can thin your blood. Do not take these medicines before your procedure if your health care provider instructs you not to.  Do not take any new dietary supplements or medicines without asking your health care provider first.  Plan to have someone take you home after the procedure. What happens during the procedure?  You may need to remove your clothing and dress in an open-back gown.  The procedure will be done while you are lying on an X-ray table. You will most likely be asked to lie on your stomach, but you may be asked to lie in a different position if an injection will be made in your neck.  Machines will be used to monitor your oxygen levels, heart rate, and blood pressure.  If an injection will be made in your neck, an IV tube will be inserted into one of your veins. Fluids and medicine will flow directly into your body through the IV tube.  The area over the facet joint where the injection will be made will be cleaned with soap. The surrounding skin will be covered with clean drapes.  A numbing medicine (local anesthetic) will be applied to your skin. Your skin may sting or burn for a moment.  A video X-ray machine (fluoroscopy) will be used to locate the joint. In some cases, a CT scan may be used.  A contrast dye may be injected into the facet joint area to help locate the joint.  When the joint is located, an anesthetic will be injected into the joint through the needle.  Your health care provider will ask you whether you feel pain relief. If you  do feel relief, a steroid may be injected to provide pain relief for a longer period of time. If you do not feel relief or feel only partial relief, additional injections of an anesthetic may be made in other  facet joints.  The needle will be removed.  Your skin will be cleaned.  A bandage (dressing) will be applied over each injection site. The procedure may vary among health care providers and hospitals. What happens after the procedure?  You will be observed for 15-30 minutes before being allowed to go home. This information is not intended to replace advice given to you by your health care provider. Make sure you discuss any questions you have with your health care provider. Document Released: 11/17/2006 Document Revised: 07/30/2015 Document Reviewed: 03/24/2015 Elsevier Interactive Patient Education  [employer].

## 2017-08-19 ENCOUNTER — Encounter: Payer: Self-pay | Admitting: Student in an Organized Health Care Education/Training Program

## 2017-09-15 ENCOUNTER — Telehealth: Payer: Self-pay | Admitting: Student in an Organized Health Care Education/Training Program

## 2017-09-15 NOTE — Telephone Encounter (Signed)
Patient is having financial problems with coming in to clinic. Her copay is $80 and she has additional billing that is not covered with insurance. She would like to talk with Dr. Cherylann RatelLateef about how often she will need to continue to come in. And about continuing as a patient. States she has sent msg thru My Chart and that Dr. Judithann SheenSparks has been trying to contact thru Epic.

## 2017-09-15 NOTE — Telephone Encounter (Signed)
Left voicemail for patient that I understand her financial issues and needing to have a clear plan in place but that I do feel that this is a conversation that needs to be had at her office visit with DR Cherylann RatelLateef.  I am unclear about the part of the message that Dr Judithann SheenSparks is trying to communicate through Epic and that I would like some further information.

## 2017-09-20 ENCOUNTER — Ambulatory Visit: Payer: BC Managed Care – PPO | Admitting: Student in an Organized Health Care Education/Training Program

## 2017-09-29 ENCOUNTER — Encounter: Payer: Self-pay | Admitting: Student in an Organized Health Care Education/Training Program

## 2017-09-29 ENCOUNTER — Other Ambulatory Visit: Payer: Self-pay

## 2017-09-29 ENCOUNTER — Ambulatory Visit
Payer: BC Managed Care – PPO | Attending: Student in an Organized Health Care Education/Training Program | Admitting: Student in an Organized Health Care Education/Training Program

## 2017-09-29 VITALS — BP 127/84 | HR 70 | Temp 97.9°F | Resp 16 | Ht 68.0 in | Wt 208.0 lb

## 2017-09-29 DIAGNOSIS — M9983 Other biomechanical lesions of lumbar region: Secondary | ICD-10-CM | POA: Diagnosis not present

## 2017-09-29 DIAGNOSIS — Z791 Long term (current) use of non-steroidal anti-inflammatories (NSAID): Secondary | ICD-10-CM | POA: Insufficient documentation

## 2017-09-29 DIAGNOSIS — F419 Anxiety disorder, unspecified: Secondary | ICD-10-CM

## 2017-09-29 DIAGNOSIS — F329 Major depressive disorder, single episode, unspecified: Secondary | ICD-10-CM | POA: Diagnosis not present

## 2017-09-29 DIAGNOSIS — Z8639 Personal history of other endocrine, nutritional and metabolic disease: Secondary | ICD-10-CM | POA: Insufficient documentation

## 2017-09-29 DIAGNOSIS — Z79891 Long term (current) use of opiate analgesic: Secondary | ICD-10-CM | POA: Diagnosis not present

## 2017-09-29 DIAGNOSIS — I10 Essential (primary) hypertension: Secondary | ICD-10-CM | POA: Diagnosis not present

## 2017-09-29 DIAGNOSIS — M48061 Spinal stenosis, lumbar region without neurogenic claudication: Secondary | ICD-10-CM | POA: Diagnosis not present

## 2017-09-29 DIAGNOSIS — M47896 Other spondylosis, lumbar region: Secondary | ICD-10-CM | POA: Diagnosis not present

## 2017-09-29 DIAGNOSIS — M5136 Other intervertebral disc degeneration, lumbar region: Secondary | ICD-10-CM | POA: Insufficient documentation

## 2017-09-29 DIAGNOSIS — M1288 Other specific arthropathies, not elsewhere classified, other specified site: Secondary | ICD-10-CM | POA: Insufficient documentation

## 2017-09-29 DIAGNOSIS — Z87891 Personal history of nicotine dependence: Secondary | ICD-10-CM | POA: Insufficient documentation

## 2017-09-29 DIAGNOSIS — Z5181 Encounter for therapeutic drug level monitoring: Secondary | ICD-10-CM | POA: Insufficient documentation

## 2017-09-29 DIAGNOSIS — E0500 Thyrotoxicosis with diffuse goiter without thyrotoxic crisis or storm: Secondary | ICD-10-CM | POA: Diagnosis not present

## 2017-09-29 DIAGNOSIS — M545 Low back pain: Secondary | ICD-10-CM | POA: Diagnosis not present

## 2017-09-29 DIAGNOSIS — Z79899 Other long term (current) drug therapy: Secondary | ICD-10-CM | POA: Diagnosis not present

## 2017-09-29 DIAGNOSIS — G894 Chronic pain syndrome: Secondary | ICD-10-CM | POA: Diagnosis not present

## 2017-09-29 DIAGNOSIS — M47816 Spondylosis without myelopathy or radiculopathy, lumbar region: Secondary | ICD-10-CM

## 2017-09-29 DIAGNOSIS — M5030 Other cervical disc degeneration, unspecified cervical region: Secondary | ICD-10-CM | POA: Diagnosis not present

## 2017-09-29 MED ORDER — HYDROCODONE-ACETAMINOPHEN 10-325 MG PO TABS: 1.0000 | ORAL_TABLET | Freq: Three times a day (TID) | ORAL | 0 refills | Status: DC | PRN

## 2017-09-29 NOTE — Patient Instructions (Signed)
You were given 3 prescriptions for Hydrocodone today. 

## 2017-09-29 NOTE — Progress Notes (Signed)
Nursing Pain Medication Assessment:  Safety precautions to be maintained throughout the outpatient stay will include: orient to surroundings, keep bed in low position, maintain call bell within reach at all times, provide assistance with transfer out of bed and ambulation.  Medication Inspection Compliance: Pill count conducted under aseptic conditions, in front of the patient. Neither the pills nor the bottle was removed from the patient's sight at any time. Once count was completed pills were immediately returned to the patient in their original bottle.  Medication: Hydrocodone/APAP Pill/Patch Count: 0 of 90 pills remain Pill/Patch Appearance: Markings consistent with prescribed medication Bottle Appearance: Standard pharmacy container. Clearly labeled. Filled Date:02/14/ 2019 Last Medication intake:  Today

## 2017-09-29 NOTE — Progress Notes (Signed)
Patient's Name: Madeline Zimmerman  MRN: [id number]  Referring Provider: Idelle Crouch, MD  DOB: [birth year]  PCP: Idelle Crouch, MD  DOS: 09/29/2017  Note by: Gillis Santa, MD  Service setting: Ambulatory outpatient  Specialty: Interventional Pain Management  Location: [facility] (AMB) Pain Management Facility    Patient type: Established   Primary Reason(s) for Visit: Encounter for prescription drug management. (Level of risk: moderate)  CC: Back Pain (lower)  HPI  Madeline Zimmerman is a 57 y.o. year old, female patient, who comes today for a medication management evaluation. She has Anxiety and depression; Benign hypertension; Chronic pain; Degeneration of intervertebral disc of lumbar region; Personal history of other endocrine, nutritional and metabolic disease; and Opioid dependence ([place]) on their problem list. Her primarily concern today is the Back Pain (lower)  Pain Assessment: Location: Lower Back Radiating: left leg to secone toe Onset: More than a month ago Duration: Chronic pain Quality: Burning Severity: 2 /10 (self-reported pain score)  Note: Reported level is compatible with observation.                         When using our objective Pain Scale, levels between 6 and 10/10 are said to belong in an emergency room, as it progressively worsens from a 6/10, described as severely limiting, requiring emergency care not usually available at an outpatient pain management facility. At a 6/10 level, communication becomes difficult and requires great effort. Assistance to reach the emergency department may be required. Facial flushing and profuse sweating along with potentially dangerous increases in heart rate and blood pressure will be evident. Effect on ADL:   Timing: Intermittent Modifying factors: stretching  Ms. Varghese was last scheduled for an appointment on 09/15/2017 for medication management. During today's appointment we reviewed [name]'s chronic pain status, as well as her outpatient  medication regimen.  The patient  reports that she does not use drugs. Her body mass index is 31.63 kg/m.  Further details on both, my assessment(s), as well as the proposed treatment plan, please see below.  Controlled Substance Pharmacotherapy Assessment REMS (Risk Evaluation and Mitigation Strategy)  Analgesic: Hydrocodone 10 mg 3 times daily as needed, quantity 55-monthMME/day: 30 mg/day.   WLandis Martins RN  09/29/2017  9:55 AM  Sign at close encounter Nursing Pain Medication Assessment:  Safety precautions to be maintained throughout the outpatient stay will include: orient to surroundings, keep bed in low position, maintain call bell within reach at all times, provide assistance with transfer out of bed and ambulation.  Medication Inspection Compliance: Pill count conducted under aseptic conditions, in front of the patient. Neither the pills nor the bottle was removed from the patient's sight at any time. Once count was completed pills were immediately returned to the patient in their original bottle.  Medication: Hydrocodone/APAP Pill/Patch Count: 0 of 90 pills remain Pill/Patch Appearance: Markings consistent with prescribed medication Bottle Appearance: Standard pharmacy container. Clearly labeled. Filled Date:02/14/ 2019 Last Medication intake:  Today   Pharmacokinetics: Liberation and absorption (onset of action): WNL Distribution (time to peak effect): WNL Metabolism and excretion (duration of action): WNL         Pharmacodynamics: Desired effects: Analgesia: Ms. PRozasreports >50% benefit. Functional ability: Patient reports that medication allows her to accomplish basic ADLs Clinically meaningful improvement in function (CMIF): Sustained CMIF goals met Perceived effectiveness: Described as relatively effective, allowing for increase in activities of daily living (ADL) Undesirable effects: Side-effects or Adverse  reactions: None reported Monitoring: [place] PMP:  Online review of the past 15-monthperiod conducted. Compliant with practice rules and regulations Last UDS on record: Summary  Date Value Ref Range Status  06/13/2017 FINAL  Final    Comment:    ==================================================================== TOXASSURE COMP DRUG ANALYSIS,UR ==================================================================== Test                             Result       Flag       Units Drug Present and Declared for Prescription Verification   Pregabalin                     PRESENT      EXPECTED Drug Present not Declared for Prescription Verification   Oxymorphone                    94           UNEXPECTED ng/mg creat   Noroxycodone                   574          UNEXPECTED ng/mg creat    Oxymorphone and noroxycodone are expected metabolites of    oxycodone. Sources of oxycodone are scheduled prescription    medications. Oxymorphone is also available as a scheduled    prescription medication.   Acetaminophen                  PRESENT      UNEXPECTED Drug Absent but Declared for Prescription Verification   Lorazepam                      Not Detected UNEXPECTED ng/mg creat   Tramadol                       Not Detected UNEXPECTED ng/mg creat   Paroxetine                     Not Detected UNEXPECTED   Ibuprofen                      Not Detected UNEXPECTED    Ibuprofen, as indicated in the declared medication list, is not    always detected even when used as directed. ==================================================================== Test                      Result    Flag   Units      Ref Range   Creatinine              94               mg/dL      >=20 ==================================================================== Declared Medications:  The flagging and interpretation on this report are based on the  following declared medications.  Unexpected results may arise from  inaccuracies in the declared medications.  **Note: The testing scope of  this panel includes these medications:  Lorazepam  Paroxetine  Pregabalin (Lyrica)  Tramadol  **Note: The testing scope of this panel does not include small to  moderate amounts of these reported medications:  Ibuprofen  **Note: The testing scope of this panel does not include following  reported medications:  Bisoprolol  Estradiol  Fluticasone  Hydrochlorothiazide  Meloxicam  Norethindrone ==================================================================== For clinical consultation, please call ([phone] ====================================================================  UDS interpretation: Compliant          Medication Assessment Form: Reviewed. Patient indicates being compliant with therapy Treatment compliance: Compliant Risk Assessment Profile: Aberrant behavior: See prior evaluations. None observed or detected today Comorbid factors increasing risk of overdose: See prior notes. No additional risks detected today Risk of substance use disorder (SUD): Low  ORT Scoring interpretation table:  Score <3 = Low Risk for SUD  Score between 4-7 = Moderate Risk for SUD  Score >8 = High Risk for Opioid Abuse   Risk Mitigation Strategies:  Patient Counseling: Covered Patient-Prescriber Agreement (PPA): Present and active  Notification to other healthcare providers: Done  Pharmacologic Plan: No change in therapy, at this time.             Laboratory Chemistry  Inflammation Markers (CRP: Acute Phase) (ESR: Chronic Phase) No results found for: CRP, ESRSEDRATE, LATICACIDVEN                       Rheumatology Markers No results found for: RF, ANA, LABURIC, URICUR, LYMEIGGIGMAB, LYMEABIGMQN              Renal Function Markers No results found for: BUN, CREATININE, GFRAA, GFRNONAA               Hepatic Function Markers No results found for: AST, ALT, ALBUMIN, ALKPHOS, HCVAB, AMYLASE, LIPASE, AMMONIA               Electrolytes No results found for: NA, K, CL,  CALCIUM, MG, PHOS                      Neuropathy Markers No results found for: VITAMINB12, FOLATE, HGBA1C, HIV               Bone Pathology Markers No results found for: VD25OH, [id number], [id number], [id number], 25OHVITD1, 25OHVITD2, 25OHVITD3, TESTOFREE, TESTOSTERONE                       Coagulation Parameters Lab Results  Component Value Date   PLT 385 06/12/2015                 Cardiovascular Markers Lab Results  Component Value Date   HGB 16.2 (H) 06/12/2015   HCT 49.0 (H) 06/12/2015                 CA Markers No results found for: CEA, CA125, LABCA2               Note: Lab results reviewed.  Recent Diagnostic Imaging Results  MR Lumbar Spine Wo Contrast CLINICAL DATA:  57 year old female with low back pain for 25 years worse in the past 3 months. Radiates to left leg and both hips. Initial encounter.  EXAM: MRI LUMBAR SPINE WITHOUT CONTRAST  TECHNIQUE: Multiplanar, multisequence MR imaging of the lumbar spine was performed. No intravenous contrast was administered.  COMPARISON:  05/09/2015 CT abdomen pelvis.  FINDINGS: Segmentation: Last fully open disk space is labeled L5-S1. Present examination incorporates from T11-12 disc space through the lower S2 level.  Alignment:  Curvature of the lumbar spine convex left.  Vertebrae: No worrisome osseous abnormality. Discogenic changes surround the L5-S1 disc space.  Conus medullaris: Extends to the L1 level and appears normal.  Paraspinal and other soft tissues: No worrisome abnormality.  Disc levels:  T11-12: Minimal Schmorl's node deformity inferior endplate T11.  Z16-R6:  Minimal Schmorl's node deformity inferior endplate T12.  V8-9: Minimal  bulge greater to the right. Minimal facet degenerative changes.  L2-3: Mild to slightly moderate facet degenerative changes. Minimal bulge greater to the right. Minimal narrowing lateral aspect of the thecal sac greater on the right.  L3-4: Prominent facet  degenerative changes greater on the right. Ligamentum flavum hypertrophy. Minimal anterior slip L3. Bulge. Slightly short pedicles. Dorsal epidural fat. Multifactorial mild thecal sac narrowing. Minimal right foraminal narrowing.  L4-5: Prominent disc degeneration with disc space narrowing. Bulge and osteophyte with greater extension right foraminal/ lateral position without compression of the exiting right L4 nerve root. Moderate facet degenerative changes greater on right. Ligamentum flavum hypertrophy. Mild narrowing lateral aspect of the thecal sac greater on right with minimal crowding of the contained right L5 nerve root.  L5-S1: Moderate facet degenerative changes. Disc degeneration with endplate reactive changes. Bulge and osteophyte with foraminal/ lateral extension greater on the left. Crowding of the course the exiting L5 nerve root greater on the left. Mild lateral recess narrowing greater on left.  IMPRESSION: L5-S1 bulge and osteophyte with foraminal/ lateral extension greater on the left. Crowding of the course the exiting L5 nerve root greater on the left. Mild lateral recess narrowing greater on left.  L4-5 bulge and osteophyte with greater extension right foraminal/ lateral position without compression of the exiting right L4 nerve root. Multifactorial mild narrowing lateral aspect of the thecal sac greater on right with minimal crowding of the contained right L5 nerve root.  L3-4 multifactorial mild thecal sac narrowing. Minimal right foraminal narrowing.  L2-3 minimal narrowing lateral aspect of the thecal sac greater on the right.  Facet degenerative changes lower lumbar spine as detailed above.  Electronically Signed   By: Genia Del M.D.   On: 03/23/2016 11:43  Complexity Note: Imaging results reviewed. Results shared with Ms. Henrene Pastor, using Layman's terms.                         Meds   Current Outpatient Medications:  .   bisoprolol-hydrochlorothiazide (ZIAC) 5-6.25 MG tablet, Take 1 tablet by mouth daily., Disp: , Rfl:  .  esomeprazole (NEXIUM) 20 MG capsule, Take 20 mg by mouth daily at 12 noon., Disp: , Rfl:  .  fluticasone (FLONASE) 50 MCG/ACT nasal spray, Place into the nose., Disp: , Rfl:  .  HYDROcodone-acetaminophen (NORCO) 10-325 MG tablet, Take 1 tablet by mouth 3 (three) times daily as needed. For chronic pain To last for 30 days from fill date. To fill on or after: 09/29/17, 10/29/17, 11/27/17, Disp: 90 tablet, Rfl: 0 .  ibuprofen (ADVIL) 200 MG tablet, Take 200 mg by mouth., Disp: , Rfl:  .  LYRICA 75 MG capsule, Take 75 mg by mouth 3 (three) times daily., Disp: , Rfl: 5 .  PARoxetine (PAXIL) 20 MG tablet, TAKE 1 TABLET (20 MG TOTAL) BY MOUTH ONCE DAILY., Disp: , Rfl:  .  estradiol (VIVELLE-DOT) 0.1 MG/24HR patch, Place onto the skin., Disp: , Rfl:  .  meloxicam (MOBIC) 7.5 MG tablet, TAKE 1 TABLET BY MOUTH EVERY DAY, Disp: , Rfl:  .  norethindrone (AYGESTIN) 5 MG tablet, Take by mouth., Disp: , Rfl:   ROS  Constitutional: Denies any fever or chills Gastrointestinal: No reported hemesis, hematochezia, vomiting, or acute GI distress Musculoskeletal: Denies any acute onset joint swelling, redness, loss of ROM, or weakness Neurological: No reported episodes of acute onset apraxia, aphasia, dysarthria, agnosia, amnesia, paralysis, loss of coordination, or loss of consciousness  Allergies  Ms. Henrene Pastor  is allergic to bacitracin and neosporin  [neomycin-bacitracin zn-polymyx].  [place]  Drug: Ms. Recendiz  reports that she does not use drugs. Alcohol:  reports that she does not drink alcohol. Tobacco:  reports that she has quit smoking. She has never used smokeless tobacco. Medical:  has a past medical history of Anxiety and depression, Chronic pain, DDD (degenerative disc disease), cervical, Graves disease, and HTN (hypertension). Surgical: Ms. Polzin  has a past surgical history that includes Reduction  mammaplasty (2012) and revision breast reduction (2013). Family: family history includes Heart failure in her father; Hypertension in her mother.  Constitutional Exam  General appearance: Well nourished, well developed, and well hydrated. In no apparent acute distress Vitals:   09/29/17 0950  BP: 127/84  Pulse: 70  Resp: 16  Temp: 97.9 F (36.6 C)  TempSrc: Oral  SpO2: 99%  Weight: 208 lb (94.3 kg)  Height: '5\' 8"'$  (1.727 m)   BMI Assessment: Estimated body mass index is 31.63 kg/m as calculated from the following:   Height as of this encounter: '5\' 8"'$  (1.727 m).   Weight as of this encounter: 208 lb (94.3 kg).  BMI interpretation table: BMI level Category Range association with higher incidence of chronic pain  <18 kg/m2 Underweight   18.5-24.9 kg/m2 Ideal body weight   25-29.9 kg/m2 Overweight Increased incidence by 20%  30-34.9 kg/m2 Obese (Class I) Increased incidence by 68%  35-39.9 kg/m2 Severe obesity (Class II) Increased incidence by 136%  >40 kg/m2 Extreme obesity (Class III) Increased incidence by 254%   BMI Readings from Last 4 Encounters:  09/29/17 31.63 kg/m  07/26/17 31.78 kg/m  06/22/17 32.23 kg/m  06/13/17 32.54 kg/m   Wt Readings from Last 4 Encounters:  09/29/17 208 lb (94.3 kg)  07/26/17 209 lb (94.8 kg)  06/22/17 212 lb (96.2 kg)  06/13/17 214 lb (97.1 kg)  Psych/Mental status: Alert, oriented x 3 (person, place, & time)       Eyes: PERLA Respiratory: No evidence of acute respiratory distress  Cervical Spine Area Exam  Skin & Axial Inspection: No masses, redness, edema, swelling, or associated skin lesions Alignment: Symmetrical Functional ROM: Unrestricted ROM      Stability: No instability detected Muscle Tone/Strength: Functionally intact. No obvious neuro-muscular anomalies detected. Sensory (Neurological): Unimpaired Palpation: No palpable anomalies              Upper Extremity (UE) Exam    Side: Right upper extremity  Side: Left upper  extremity  Skin & Extremity Inspection: Skin color, temperature, and hair growth are WNL. No peripheral edema or cyanosis. No masses, redness, swelling, asymmetry, or associated skin lesions. No contractures.  Skin & Extremity Inspection: Skin color, temperature, and hair growth are WNL. No peripheral edema or cyanosis. No masses, redness, swelling, asymmetry, or associated skin lesions. No contractures.  Functional ROM: Unrestricted ROM          Functional ROM: Unrestricted ROM          Muscle Tone/Strength: Functionally intact. No obvious neuro-muscular anomalies detected.  Muscle Tone/Strength: Functionally intact. No obvious neuro-muscular anomalies detected.  Sensory (Neurological): Unimpaired          Sensory (Neurological): Unimpaired          Palpation: No palpable anomalies              Palpation: No palpable anomalies              Specialized Test(s): Deferred         Specialized  Test(s): Deferred          Thoracic Spine Area Exam  Skin & Axial Inspection: No masses, redness, or swelling Alignment: Symmetrical Functional ROM: Unrestricted ROM Stability: No instability detected Muscle Tone/Strength: Functionally intact. No obvious neuro-muscular anomalies detected. Sensory (Neurological): Unimpaired Muscle strength & Tone: No palpable anomalies  Lumbar Spine Area Exam  Skin & Axial Inspection: No masses, redness, or swelling Alignment: Symmetrical Functional ROM: Unrestricted ROM      Stability: No instability detected Muscle Tone/Strength: Functionally intact. No obvious neuro-muscular anomalies detected. Sensory (Neurological): Articular pain pattern Palpation: No palpable anomalies       Provocative Tests: Lumbar Hyperextension and rotation test: Positive bilaterally for facet joint pain. Lumbar Lateral bending test: evaluation deferred today       Patrick's Maneuver: evaluation deferred today                    Gait & Posture Assessment  Ambulation: Unassisted Gait:  Relatively normal for age and body habitus Posture: WNL   Lower Extremity Exam    Side: Right lower extremity  Side: Left lower extremity  Skin & Extremity Inspection: Skin color, temperature, and hair growth are WNL. No peripheral edema or cyanosis. No masses, redness, swelling, asymmetry, or associated skin lesions. No contractures.  Skin & Extremity Inspection: Skin color, temperature, and hair growth are WNL. No peripheral edema or cyanosis. No masses, redness, swelling, asymmetry, or associated skin lesions. No contractures.  Functional ROM: Unrestricted ROM          Functional ROM: Unrestricted ROM          Muscle Tone/Strength: Functionally intact. No obvious neuro-muscular anomalies detected.  Muscle Tone/Strength: Functionally intact. No obvious neuro-muscular anomalies detected.  Sensory (Neurological): Unimpaired  Sensory (Neurological): Unimpaired  Palpation: No palpable anomalies  Palpation: No palpable anomalies   Assessment  Primary Diagnosis & Pertinent Problem List: The primary encounter diagnosis was Lumbar spondylosis. Diagnoses of Lumbar facet arthropathy, Chronic pain syndrome, Anxiety and depression, Lumbar foraminal stenosis, Spinal stenosis of lumbar region without neurogenic claudication, and Chronic prescription opiate use were also pertinent to this visit.  Status Diagnosis  Persistent Persistent Persistent 1. Lumbar spondylosis   2. Lumbar facet arthropathy   3. Chronic pain syndrome   4. Anxiety and depression   5. Lumbar foraminal stenosis   6. Spinal stenosis of lumbar region without neurogenic claudication   7. Chronic prescription opiate use      General Recommendations: The pain condition that the patient suffers from is best treated with a multidisciplinary approach that involves an increase in physical activity to prevent de-conditioning and worsening of the pain cycle, as well as psychological counseling (formal and/or informal) to address the co-morbid  psychological affects of pain. Treatment will often involve judicious use of pain medications and interventional procedures to decrease the pain, allowing the patient to participate in the physical activity that will ultimately produce long-lasting pain reductions. The goal of the multidisciplinary approach is to return the patient to a higher level of overall function and to restore their ability to perform activities of daily living.  57 year old female with a history of axial low back pain left greater than right with radiation into the left leg. Back pain is chronic in nature and could be secondary to L5-S1 disc bulge causing foraminal and lateral recess compression specifically involving the L5 nerve root on the left. Patient also has multilevel lumbar degenerative disc disease and facet arthropathy, spondylosis that could be explaining her  localized axial back pain which is different in quality and nature that her sharp shooting pain, intermittent, and her left leg. We discussed lumbar diagnostic facet blocks with goal radiofrequency ablation.   Patient is concerned that insurance may not be able to cover this she is still having difficulty paying her clinic visits.  This is certainly a treatment option if the patient would like to proceed.  In regards to medication management, patient continues Lyrica 75 mg daily as well as hydrocodone 10 mg 3 times daily as needed.  Patient is requesting to have medication management transferred back to her PCP Dr. Doy Hutching.  I believe that this is reasonable.  Patient's UDS with me have been appropriate.  She has been compliant with medical therapy while under my care.  Patient can see me as needed if her pain worsens or if she would like to pursue lumbar facet medial branch nerve blocks for axial low back pain.  I will provide her with a refill of hydrocodone for 3 months at her current dose.  Depending upon what her PCP says in regards to her opioid medication  management, I can see the patient on an as-needed basis during pain flares, to workup new pain, for interventional procedures discussed above or can continue medication management which is proving financially difficult for the patient.  Plan of Care  Pharmacotherapy (Medications Ordered): Meds ordered this encounter  Medications  . DISCONTD: HYDROcodone-acetaminophen (NORCO) 10-325 MG tablet    Sig: Take 1 tablet by mouth 3 (three) times daily as needed. For chronic pain To last for 30 days from fill date. To fill on or after: 09/29/17, 10/29/17, 11/27/17    Dispense:  90 tablet    Refill:  0  . DISCONTD: HYDROcodone-acetaminophen (NORCO) 10-325 MG tablet    Sig: Take 1 tablet by mouth 3 (three) times daily as needed. For chronic pain To last for 30 days from fill date. To fill on or after: 09/29/17, 10/29/17, 11/27/17    Dispense:  90 tablet    Refill:  0  . HYDROcodone-acetaminophen (NORCO) 10-325 MG tablet    Sig: Take 1 tablet by mouth 3 (three) times daily as needed. For chronic pain To last for 30 days from fill date. To fill on or after: 09/29/17, 10/29/17, 11/27/17    Dispense:  90 tablet    Refill:  0   Provider-requested follow-up: Return in about 3 months (around 12/30/2017) for Medication Management. Time Note: Greater than 50% of the 25 minute(s) of face-to-face time spent with Ms. Casserly, was spent in counseling/coordination of care regarding: "Drug Holidays", Ms. Besancon's primary cause of pain, the treatment plan, treatment alternatives, medication side effects, the opioid analgesic risks and possible complications, the appropriate use of her medications, realistic expectations and the medication agreement. Future Appointments  Date Time Provider [place]  12/27/2017  8:45 AM Gillis Santa, MD [facility] None    Primary Care Physician: Idelle Crouch, MD Location: [facility] Outpatient Pain Management Facility Note by: Gillis Santa, M.D Date: 09/29/2017; Time: 10:39  AM  Patient Instructions  You were given 3 prescriptions for Hydrocodone today.

## 2017-10-08 ENCOUNTER — Encounter: Payer: Self-pay | Admitting: Student in an Organized Health Care Education/Training Program

## 2017-10-10 ENCOUNTER — Telehealth: Payer: Self-pay | Admitting: *Deleted

## 2017-10-10 NOTE — Telephone Encounter (Signed)
LVM for patient to return call. 

## 2017-10-12 NOTE — Progress Notes (Unsigned)
Patient would like to speak with Nurse again

## 2017-12-27 ENCOUNTER — Encounter: Payer: BC Managed Care – PPO | Admitting: Student in an Organized Health Care Education/Training Program

## 2018-10-06 ENCOUNTER — Ambulatory Visit: Payer: BC Managed Care – PPO | Admitting: Family Medicine

## 2020-03-27 ENCOUNTER — Other Ambulatory Visit: Payer: Self-pay | Admitting: Internal Medicine

## 2020-03-27 DIAGNOSIS — R6881 Early satiety: Secondary | ICD-10-CM

## 2020-03-27 DIAGNOSIS — Z1231 Encounter for screening mammogram for malignant neoplasm of breast: Secondary | ICD-10-CM

## 2020-04-02 ENCOUNTER — Ambulatory Visit
Admission: RE | Admit: 2020-04-02 | Discharge: 2020-04-02 | Disposition: A | Discharge status: Home / Self Care | Payer: BC Managed Care – PPO | Source: Ambulatory Visit | Attending: Internal Medicine | Admitting: Internal Medicine

## 2020-04-02 ENCOUNTER — Other Ambulatory Visit: Payer: Self-pay

## 2020-04-02 DIAGNOSIS — R6881 Early satiety: Secondary | ICD-10-CM | POA: Insufficient documentation

## 2020-04-04 ENCOUNTER — Ambulatory Visit: Payer: BC Managed Care – PPO

## 2021-01-13 IMAGING — RF DG UGI W/ HIGH DENSITY W/O KUB
11 of 18 series · 14 of 24 positions shown · non-contrast
Comparison: None.

CLINICAL DATA: Early satiety, bloated

EXAM:
UPPER GI SERIES WITHOUT KUB
TECHNIQUE: Routine upper GI series was performed with thin/high density/water
soluble barium.
FLUOROSCOPY TIME:  Fluoroscopy Time:  36 seconds
Radiation Exposure Index (if provided by the fluoroscopic device):
3.3 mGy
Number of Acquired Spot Images: 0

[Series 1: cp_standard · 0.25mm/px · 2 of 57 frames shown (1 of 11)]
[frame 9/57]
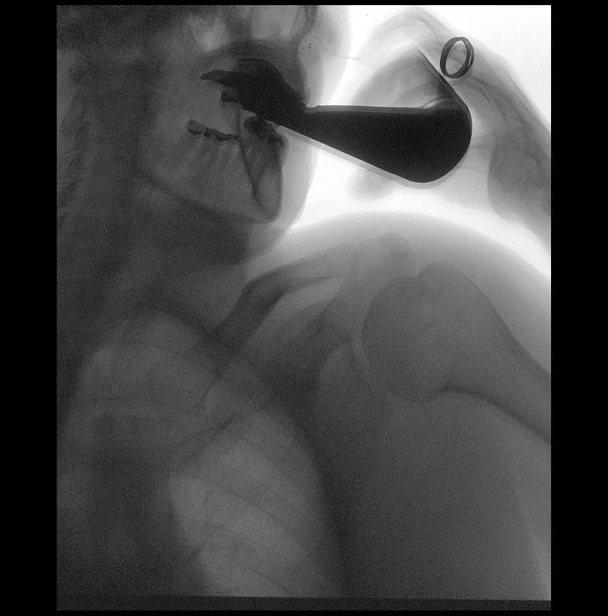
[frame 29/57]
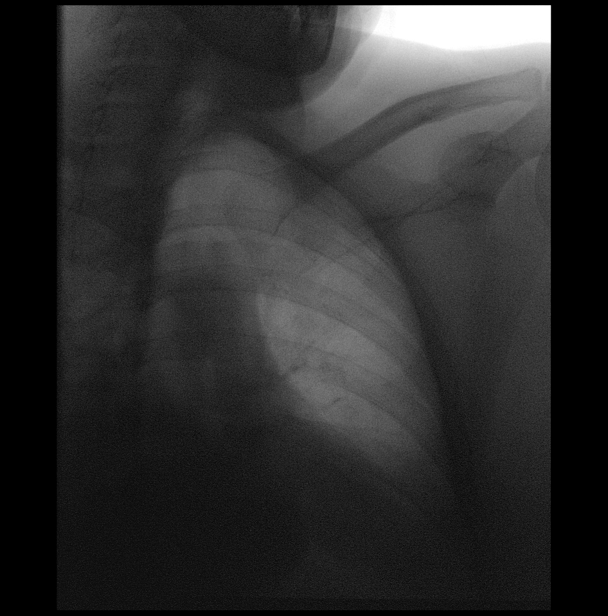

[Series 2: cp_standard · 0.25mm/px · 3 of 39 frames shown (2 of 11)]
[frame 1/39]
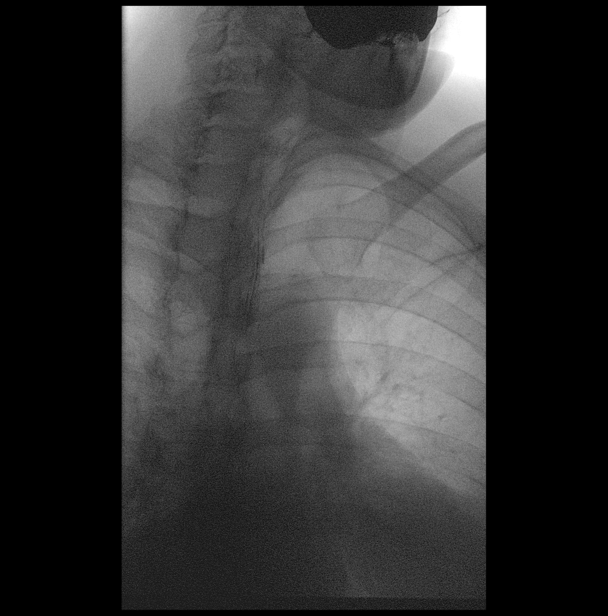
[frame 20/39]
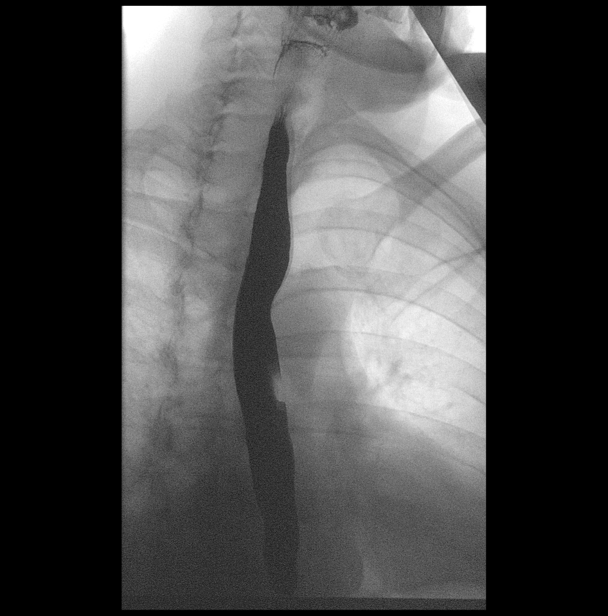
[frame 34/39]
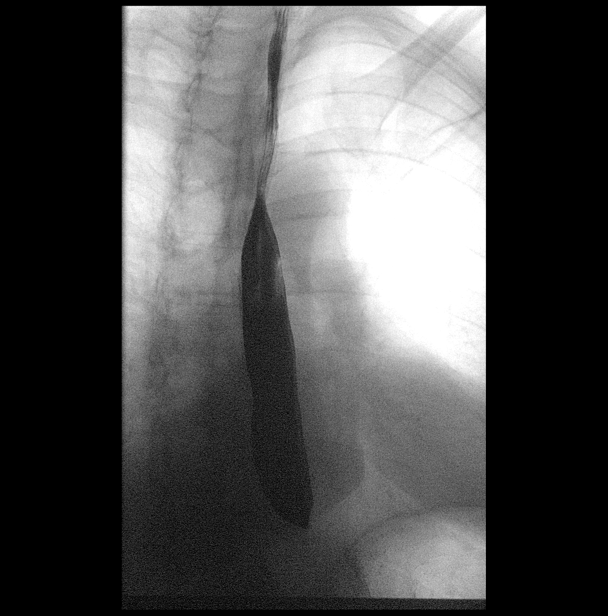

[Series 4: cp_standard · 0.25mm/px · 1 of 1 slices shown (3 of 11)]
[im 1/1]
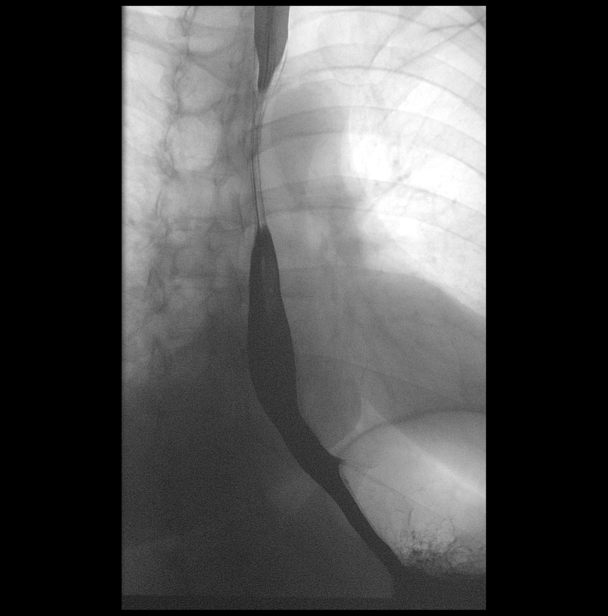

[Series 6: cp_standard · 0.28mm/px · 1 of 1 slices shown (4 of 11)]
[im 1/1]
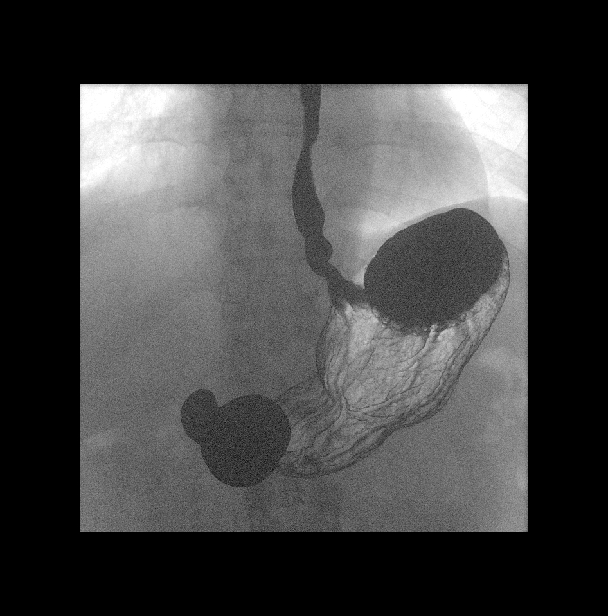

[Series 7: cp_standard · 0.28mm/px · 1 of 1 slices shown (5 of 11)]
[im 1/1]
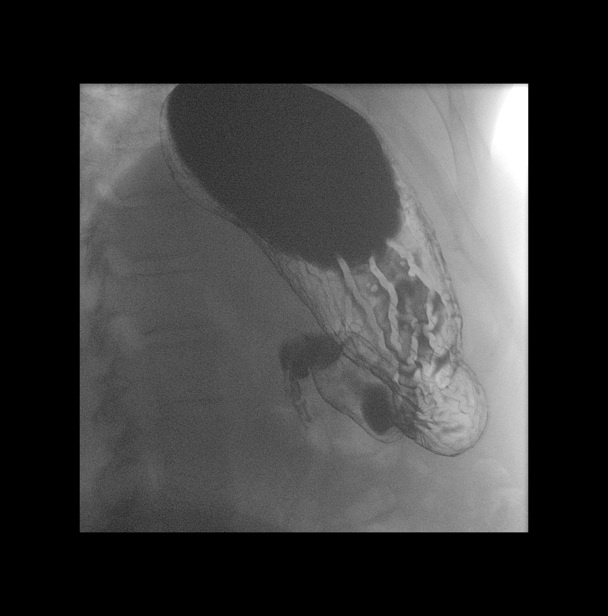

[Series 9: cp_standard · 0.28mm/px · 1 of 1 slices shown (6 of 11)]
[im 1/1]
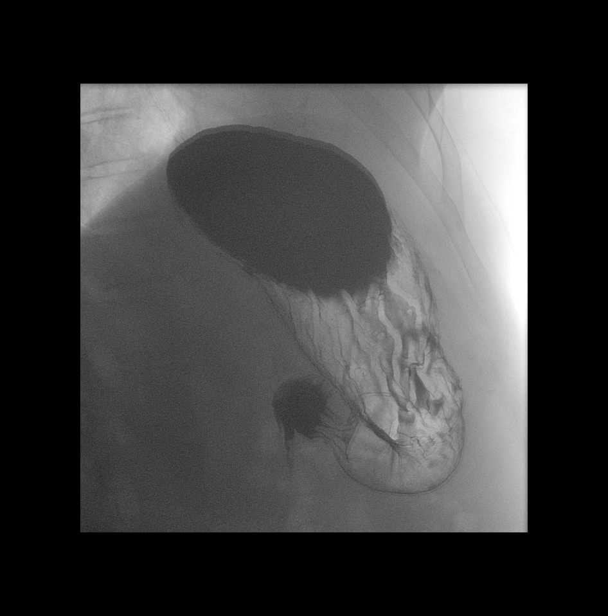

[Series 11: cp_standard · 0.28mm/px · 1 of 1 slices shown (7 of 11)]
[im 1/1]
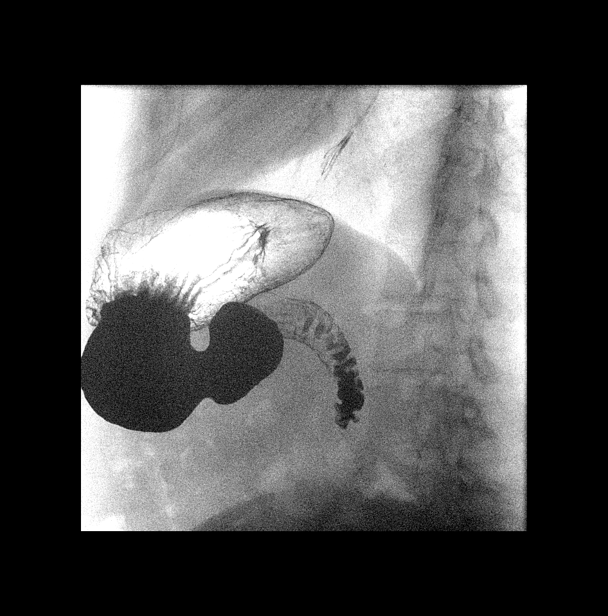

[Series 13: cp_standard · 0.29mm/px · 1 of 1 slices shown (8 of 11)]
[im 1/1]
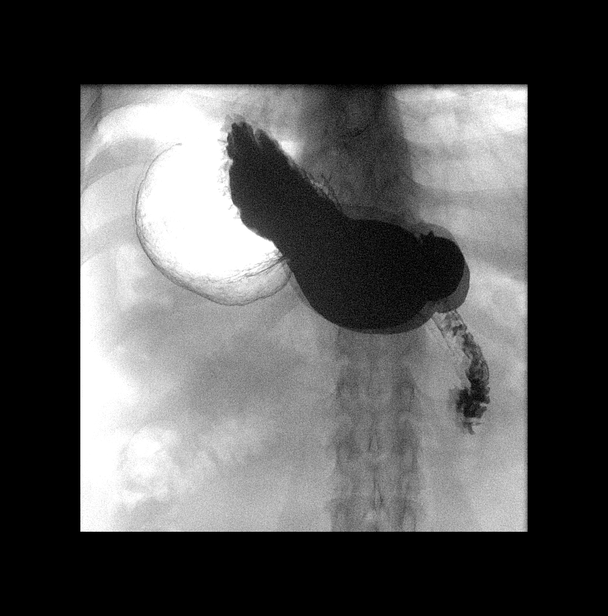

[Series 14: cp_standard · 0.29mm/px · 1 of 1 slices shown (9 of 11)]
[im 1/1]
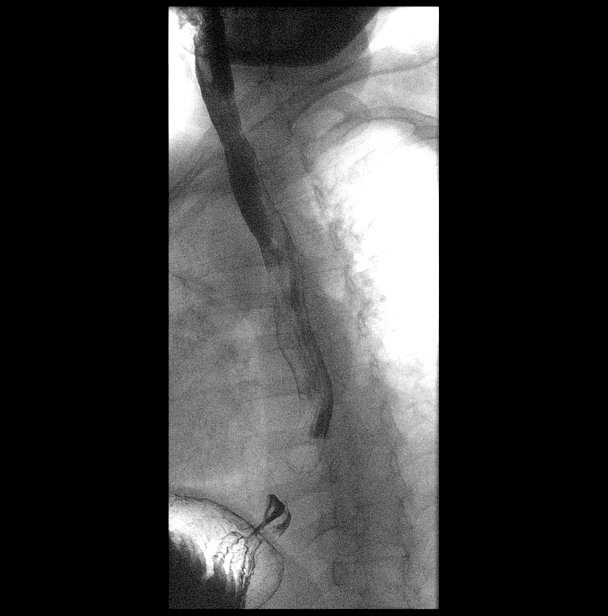

[Series 16: cp_standard · 0.29mm/px · 1 of 1 slices shown (10 of 11)]
[im 1/1]
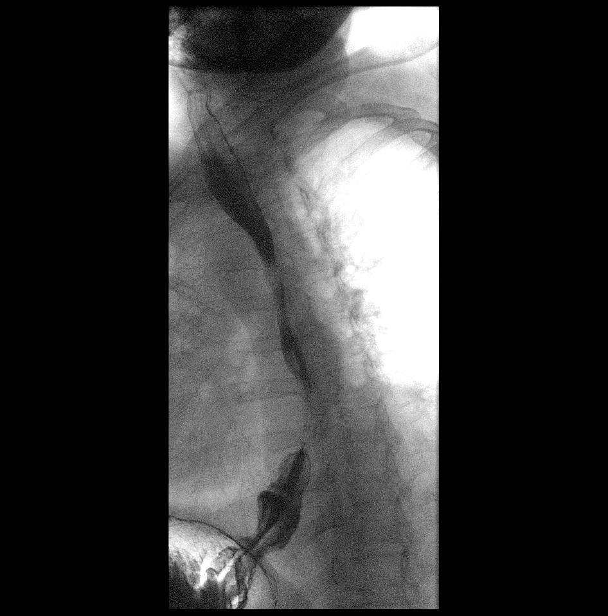

[Series 18: cp_standard · 0.29mm/px · 1 of 1 slices shown (11 of 11)]
[im 1/1]
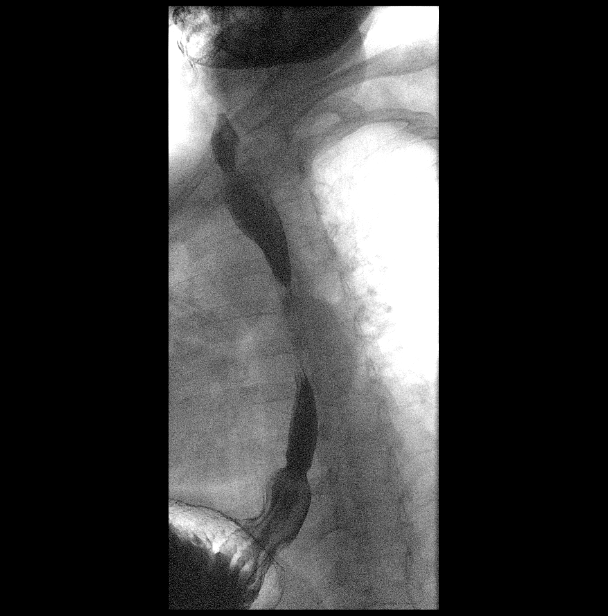

[14 of 24 positions shown; findings below may reference images not displayed]

FINDINGS: Examination of the esophagus demonstrated normal esophageal
motility. Normal esophageal morphology without evidence of
esophagitis or ulceration. No esophageal stricture, diverticula, or
mass lesion. No evidence of hiatal hernia. Moderate-severe
gastroesophageal reflux.

Examination of the stomach demonstrated normal rugal folds and areae
gastricae. The gastric mucosa appeared unremarkable without evidence
of ulceration, scarring, or mass lesion. Gastric motility and
emptying was normal. Fluoroscopic examination of the duodenum
demonstrates normal motility and morphology without evidence of
ulceration or mass lesion.
IMPRESSION: 1. Moderate-severe gastroesophageal reflux.

## 2021-03-23 ENCOUNTER — Other Ambulatory Visit: Payer: Self-pay | Admitting: Internal Medicine

## 2021-03-23 DIAGNOSIS — Z1231 Encounter for screening mammogram for malignant neoplasm of breast: Secondary | ICD-10-CM

## 2021-05-28 ENCOUNTER — Ambulatory Visit: Payer: BC Managed Care – PPO | Admitting: Student in an Organized Health Care Education/Training Program

## 2021-06-15 ENCOUNTER — Other Ambulatory Visit: Payer: Self-pay | Admitting: Internal Medicine

## 2021-06-15 DIAGNOSIS — Z1231 Encounter for screening mammogram for malignant neoplasm of breast: Secondary | ICD-10-CM

## 2021-09-07 ENCOUNTER — Encounter: Payer: Self-pay | Admitting: *Deleted

## 2021-09-10 DIAGNOSIS — D751 Secondary polycythemia: Secondary | ICD-10-CM | POA: Insufficient documentation

## 2021-09-10 NOTE — Progress Notes (Deleted)
?[facility]  ?[phone] [phone] ? ?ID: Verner Mould OB: 10-13-1960  MR#: [id number]  UTM#:[id number] ? ?Patient Care Team: ?Jeralyn Ruths, MD as Consulting Physician (Hematology) ?Margaretann Loveless, MD as Referring Physician (Internal Medicine) ? ?CHIEF COMPLAINT: Polycythemia. ? ?INTERVAL HISTORY: *** ? ?REVIEW OF SYSTEMS:   ?ROS ? ?As per HPI. Otherwise, a complete review of systems is negative. ? ?PAST MEDICAL HISTORY: ?Past Medical History:  ?Diagnosis Date  ? Anxiety and depression   ? Chronic pain   ? With narcotic dependence  ? DDD (degenerative disc disease), cervical   ? Graves disease   ? HTN (hypertension)   ? Polycythemia   ? ? ?PAST SURGICAL HISTORY: ?Past Surgical History:  ?Procedure Laterality Date  ? REDUCTION MAMMAPLASTY  2012  ? revision breast reduction  2013  ? ? ?FAMILY HISTORY: ?Family History  ?Problem Relation Age of Onset  ? Hypertension Mother   ? Heart failure Father   ? ? ?ADVANCED DIRECTIVES (Y/N):  N ? ?HEALTH MAINTENANCE: ?Social History  ? ?Tobacco Use  ? Smoking status: Former  ? Smokeless tobacco: Never  ?Substance Use Topics  ? Alcohol use: No  ?  Alcohol/week: 0.0 standard drinks  ? Drug use: No  ? ? ? Colonoscopy: ? PAP: ? Bone density: ? Lipid panel: ? ?Allergies  ?Allergen Reactions  ? Bacitracin Itching and Rash  ? Neosporin  [Neomycin-Bacitracin Zn-Polymyx] Rash  ? ? ?Current Outpatient Medications  ?Medication Sig Dispense Refill  ? bisoprolol-hydrochlorothiazide (ZIAC) 5-6.25 MG tablet Take 1 tablet by mouth daily.    ? esomeprazole (NEXIUM) 20 MG capsule Take 20 mg by mouth daily at 12 noon.    ? estradiol (VIVELLE-DOT) 0.1 MG/24HR patch Place onto the skin.    ? fluticasone (FLONASE) 50 MCG/ACT nasal spray Place into the nose.    ? HYDROcodone-acetaminophen (NORCO) 10-325 MG tablet Take 1 tablet by mouth 3 (three) times daily as needed. For chronic pain ?To last for 30 days from fill date. ?To fill on or after: 09/29/17,  10/29/17, 11/27/17 90 tablet 0  ? ibuprofen (ADVIL) 200 MG tablet Take 200 mg by mouth.    ? LYRICA 75 MG capsule Take 75 mg by mouth 3 (three) times daily.  5  ? meloxicam (MOBIC) 7.5 MG tablet TAKE 1 TABLET BY MOUTH EVERY DAY    ? norethindrone (AYGESTIN) 5 MG tablet Take by mouth.    ? PARoxetine (PAXIL) 20 MG tablet TAKE 1 TABLET (20 MG TOTAL) BY MOUTH ONCE DAILY.    ? ?No current facility-administered medications for this visit.  ? ? ?OBJECTIVE: ?There were no vitals filed for this visit.   There is no height or weight on file to calculate BMI.    ECOG FS:{CHL ONC [id number]} ? ?General: Well-developed, well-nourished, no acute distress. ?Eyes: Pink conjunctiva, anicteric sclera. ?HEENT: Normocephalic, moist mucous membranes. ?Lungs: No audible wheezing or coughing. ?Heart: Regular rate and rhythm. ?Abdomen: Soft, nontender, no obvious distention. ?Musculoskeletal: No edema, cyanosis, or clubbing. ?Neuro: Alert, answering all questions appropriately. Cranial nerves grossly intact. ?Skin: No rashes or petechiae noted. ?Psych: Normal affect. ?Lymphatics: No cervical, calvicular, axillary or inguinal LAD. ? ? ?LAB RESULTS: ? ?No results found for: NA, K, CL, CO2, GLUCOSE, BUN, CREATININE, CALCIUM, PROT, ALBUMIN, AST, ALT, ALKPHOS, BILITOT, GFRNONAA, GFRAA ? ?Lab Results  ?Component Value Date  ? WBC 8.1 06/12/2015  ? HGB 16.2 (H) 06/12/2015  ? HCT 49.0 (H) 06/12/2015  ? MCV 90.0 06/12/2015  ?  PLT 385 06/12/2015  ? ? ? ?STUDIES: ?No results found. ? ?ASSESSMENT: Polycythemia. ? ?PLAN:   ? ?Polycythemia: ? ?Patient expressed understanding and was in agreement with this plan. She also understands that She can call clinic at any time with any questions, concerns, or complaints.  ? ? Cancer Staging  ?No matching staging information was found for the patient. ? ?Jeralyn Ruths, MD   09/10/2021 11:12 AM ? ? ? ? ?

## 2021-09-15 ENCOUNTER — Encounter: Payer: BC Managed Care – PPO | Admitting: Oncology

## 2021-09-15 ENCOUNTER — Other Ambulatory Visit: Payer: BC Managed Care – PPO

## 2021-11-13 ENCOUNTER — Inpatient Hospital Stay: Payer: BC Managed Care – PPO | Admitting: Oncology

## 2021-11-13 ENCOUNTER — Inpatient Hospital Stay: Payer: BC Managed Care – PPO

## 2021-11-16 ENCOUNTER — Inpatient Hospital Stay: Payer: BC Managed Care – PPO

## 2021-11-16 ENCOUNTER — Inpatient Hospital Stay: Payer: BC Managed Care – PPO | Admitting: Oncology

## 2021-11-18 ENCOUNTER — Telehealth: Payer: Self-pay

## 2021-11-18 NOTE — Telephone Encounter (Signed)
Called to go over new patient appt tomorrow with Dr. Cathie Hoops. Patient did not answer.  ?

## 2021-11-19 ENCOUNTER — Encounter: Payer: Self-pay | Admitting: Oncology

## 2021-11-19 ENCOUNTER — Inpatient Hospital Stay: Payer: BC Managed Care – PPO

## 2021-11-19 ENCOUNTER — Inpatient Hospital Stay: Payer: BC Managed Care – PPO | Attending: Oncology | Admitting: Oncology

## 2021-11-19 ENCOUNTER — Other Ambulatory Visit: Payer: Self-pay

## 2021-11-19 VITALS — BP 181/102 | HR 84 | Temp 97.4°F | Ht 68.0 in | Wt 217.0 lb

## 2021-11-19 DIAGNOSIS — D751 Secondary polycythemia: Secondary | ICD-10-CM

## 2021-11-19 DIAGNOSIS — Z148 Genetic carrier of other disease: Secondary | ICD-10-CM

## 2021-11-19 DIAGNOSIS — I10 Essential (primary) hypertension: Secondary | ICD-10-CM | POA: Insufficient documentation

## 2021-11-19 DIAGNOSIS — Z79899 Other long term (current) drug therapy: Secondary | ICD-10-CM | POA: Insufficient documentation

## 2021-11-19 DIAGNOSIS — G8929 Other chronic pain: Secondary | ICD-10-CM | POA: Insufficient documentation

## 2021-11-19 DIAGNOSIS — Z87891 Personal history of nicotine dependence: Secondary | ICD-10-CM | POA: Insufficient documentation

## 2021-11-19 DIAGNOSIS — M5030 Other cervical disc degeneration, unspecified cervical region: Secondary | ICD-10-CM | POA: Insufficient documentation

## 2021-11-19 DIAGNOSIS — Z791 Long term (current) use of non-steroidal anti-inflammatories (NSAID): Secondary | ICD-10-CM | POA: Insufficient documentation

## 2021-11-19 LAB — CBC WITH DIFFERENTIAL/PLATELET
Abs Immature Granulocytes: 0.03 10*3/uL (ref 0.00–0.07)
Basophils Absolute: 0.1 10*3/uL (ref 0.0–0.1)
Basophils Relative: 1 %
Eosinophils Absolute: 0.2 10*3/uL (ref 0.0–0.5)
Eosinophils Relative: 2 %
HCT: 49.7 % — ABNORMAL HIGH (ref 36.0–46.0)
Hemoglobin: 16.2 g/dL — ABNORMAL HIGH (ref 12.0–15.0)
Immature Granulocytes: 0 %
Lymphocytes Relative: 33 %
Lymphs Abs: 2.7 10*3/uL (ref 0.7–4.0)
MCH: 29.6 pg (ref 26.0–34.0)
MCHC: 32.6 g/dL (ref 30.0–36.0)
MCV: 90.9 fL (ref 80.0–100.0)
Monocytes Absolute: 0.6 10*3/uL (ref 0.1–1.0)
Monocytes Relative: 8 %
Neutro Abs: 4.6 10*3/uL (ref 1.7–7.7)
Neutrophils Relative %: 56 %
Platelets: 321 10*3/uL (ref 150–400)
RBC: 5.47 MIL/uL — ABNORMAL HIGH (ref 3.87–5.11)
RDW: 13.3 % (ref 11.5–15.5)
WBC: 8.2 10*3/uL (ref 4.0–10.5)
nRBC: 0 % (ref 0.0–0.2)

## 2021-11-19 LAB — FERRITIN: Ferritin: 35 ng/mL (ref 11–307)

## 2021-11-19 LAB — IRON AND TIBC
Iron: 61 ug/dL (ref 28–170)
Saturation Ratios: 16 % (ref 10.4–31.8)
TIBC: 379 ug/dL (ref 250–450)
UIBC: 318 ug/dL

## 2021-11-19 LAB — TECHNOLOGIST SMEAR REVIEW: Plt Morphology: ADEQUATE

## 2021-11-19 NOTE — Progress Notes (Signed)
?Hematology/Oncology Consult note ?[phone] [phone] ?  ? ?   ? ? ?Patient Care Team: ?Margaretann LovelessKhan, Neelam S, MD as PCP - General (Internal Medicine) ?Margaretann LovelessKhan, Neelam S, MD as Referring Physician (Internal Medicine) ? ?REFERRING PROVIDER: ?Margaretann LovelessKhan, Neelam S, MD  ?CHIEF COMPLAINTS/REASON FOR VISIT:  ?Evaluation of erythrocytosis ? ?HISTORY OF PRESENTING ILLNESS:  ? ?Madeline Zimmerman is a  61 y.o.  female with PMH listed below was seen in consultation at the request of  Margaretann LovelessKhan, Neelam S, MD  for evaluation of erythrocytosis. ? 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  Patient was previously seen by Dr. Teodoro SprayFinnergan in 2016.  JAK2 V617F mutation was checked was negative.  Patient had a slight increased carbon monoxide level at that time.  Was recommended for observation. ? ?Patient was referred by to establish care.  Denies any constitutional symptoms. ?She denies any smoking.  Denies sleeping disturbance. ? ?Review of Systems  ?Constitutional:  Negative for appetite change, chills, fatigue and fever.  ?HENT:   Negative for hearing loss and voice change.   ?Eyes:  Negative for eye problems.  ?Respiratory:  Negative for chest tightness and cough.   ?Cardiovascular:  Negative for chest pain.  ?Gastrointestinal:  Negative for abdominal distention, abdominal pain and blood in stool.  ?Endocrine: Negative for hot flashes.  ?Genitourinary:  Negative for difficulty urinating and frequency.   ?Musculoskeletal:  Negative for arthralgias.  ?Skin:  Negative for itching and rash.  ?Neurological:  Negative for extremity weakness.  ?Hematological:  Negative for adenopathy.  ?Psychiatric/Behavioral:  Negative for confusion.   ? ?MEDICAL HISTORY:  ?Past Medical History:  ?Diagnosis Date  ? Anxiety and depression   ? Chronic pain   ? With narcotic dependence  ? DDD (degenerative disc disease), cervical   ? Graves disease   ? HTN (hypertension)   ?  Polycythemia   ? ? ?SURGICAL HISTORY: ?Past Surgical History:  ?Procedure Laterality Date  ? REDUCTION MAMMAPLASTY  2012  ? revision breast reduction  2013  ? ? ?SOCIAL HISTORY: ?Social History  ? ?Socioeconomic History  ? Marital status: Widowed  ?  Spouse name: Not on file  ? Number of children: Not on file  ? Years of education: Not on file  ? Highest education level: Not on file  ?Occupational History  ? Not on file  ?Tobacco Use  ? Smoking status: Former  ? Smokeless tobacco: Never  ?Substance and Sexual Activity  ? Alcohol use: No  ?  Alcohol/week: 0.0 standard drinks  ? Drug use: No  ? Sexual activity: Not Currently  ?Other Topics Concern  ? Not on file  ?Social History Narrative  ? Not on file  ? ?Social Determinants of Health  ? ?Financial Resource Strain: Not on file  ?Food Insecurity: Not on file  ?Transportation Needs: Not on file  ?Physical Activity: Not on file  ?Stress: Not on file  ?Social Connections: Not on file  ?Intimate Partner Violence: Not on file  ? ? ?FAMILY HISTORY: ?Family History  ?Problem Relation Age of Onset  ? Hypertension Mother   ? Heart failure Father   ? ? ?ALLERGIES:  is allergic to bacitracin and neosporin  [neomycin-bacitracin zn-polymyx]. ? ?MEDICATIONS:  ?Current Outpatient Medications  ?Medication Sig Dispense Refill  ? bisoprolol-hydrochlorothiazide (ZIAC) 5-6.25 MG tablet Take 1 tablet by mouth daily.    ? DULoxetine (CYMBALTA) 60 MG capsule DULoxetine HCl 60 MG Oral Capsule Delayed Release Particles QTY:  90  Days: 90 Refills: 0  Written: 05/15/21 Patient Instructions:    ? esomeprazole (NEXIUM) 20 MG capsule Take 20 mg by mouth daily at 12 noon.    ? fluticasone (FLONASE) 50 MCG/ACT nasal spray Place into the nose.    ? gabapentin (NEURONTIN) 300 MG capsule Gabapentin 300 MG Oral Capsule QTY: 30 capsule Days: 30 Refills: 3  Written: 07/30/21 Patient Instructions: 1 every bedtime    ? ibuprofen (ADVIL) 200 MG tablet Take 200 mg by mouth.    ? meloxicam (MOBIC) 7.5 MG  tablet TAKE 1 TABLET BY MOUTH EVERY DAY    ? omeprazole (PRILOSEC) 40 MG capsule Omeprazole 40 MG Oral Capsule Delayed Release QTY: 90  Days: 90 Refills: 0  Written: 04/05/21 Patient Instructions:    ? QUEtiapine (SEROQUEL) 25 MG tablet Take 1 tablet by mouth at bedtime.    ? rosuvastatin (CRESTOR) 10 MG tablet Rosuvastatin Calcium 10 MG Oral Tablet QTY: 90 tablet Days: 90 Refills: 3  Written: 06/01/21 Patient Instructions: take one tablet by mouth once daily    ? traMADol (ULTRAM) 50 MG tablet traMADol HCl 50 MG Oral Tablet QTY: 30 tablet Days: 30 Refills: 1  Written: 10/29/21 Patient Instructions: 1 po qd prn only    ? estradiol (VIVELLE-DOT) 0.1 MG/24HR patch Place onto the skin.    ? HYDROcodone-acetaminophen (NORCO) 10-325 MG tablet Take 1 tablet by mouth 3 (three) times daily as needed. For chronic pain ?To last for 30 days from fill date. ?To fill on or after: 09/29/17, 10/29/17, 11/27/17 (Patient not taking: Reported on 11/19/2021) 90 tablet 0  ? LYRICA 75 MG capsule Take 75 mg by mouth 3 (three) times daily. (Patient not taking: Reported on 11/19/2021)  5  ? norethindrone (AYGESTIN) 5 MG tablet Take by mouth. (Patient not taking: Reported on 11/19/2021)    ? PARoxetine (PAXIL) 20 MG tablet TAKE 1 TABLET (20 MG TOTAL) BY MOUTH ONCE DAILY. (Patient not taking: Reported on 11/19/2021)    ? ?No current facility-administered medications for this visit.  ? ? ? ?PHYSICAL EXAMINATION: ? ?Vitals:  ? 11/19/21 0940  ?BP: (!) 181/102  ?Pulse: 84  ?Temp: (!) 97.4 ?F (36.3 ?C)  ? ?Filed Weights  ? 11/19/21 0940  ?Weight: 217 lb (98.4 kg)  ? ? ?Physical Exam ? ?LABORATORY DATA:  ?I have reviewed the data as listed ?Lab Results  ?Component Value Date  ? WBC 8.2 11/19/2021  ? HGB 16.2 (H) 11/19/2021  ? HCT 49.7 (H) 11/19/2021  ? MCV 90.9 11/19/2021  ? PLT 321 11/19/2021  ? ?No results for input(s): NA, K, CL, CO2, GLUCOSE, BUN, CREATININE, CALCIUM, GFRNONAA, GFRAA, PROT, ALBUMIN, AST, ALT, ALKPHOS, BILITOT, BILIDIR, IBILI in the  last 8760 hours. ?Iron/TIBC/Ferritin/ %Sat ?   ?Component Value Date/Time  ? IRON 61 11/19/2021 1017  ? TIBC 379 11/19/2021 1017  ? FERRITIN 35 11/19/2021 1017  ? IRONPCTSAT 16 11/19/2021 1017  ?  ? ? ?RADIOGRAPHIC STUDIES: ?I have personally reviewed the radiological images as listed and agreed with the findings in the report. ?No results found. ? ? ? ?ASSESSMENT & PLAN:  ?1. Erythrocytosis   ?2. Hemochromatosis carrier   ? 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 ?Likely secondary.  Recommend patient to have sleep study done to rule out sleep apnea ?I will check JAK2 exon 12-15/MPL/CALR mutation.  BCR ABL.  Carbon monoxide level, erythropoietin level, ? ?#Heterozygous hemochromatosis, H63D mutation. ?Patient asymptomatic.  Check iron, TIBC ferritin.  Recommend patient to avoid iron supplementation,  avoid alcohol.  Recommend first-degree relatives to be screened. ? ?Orders Placed This Encounter  ?Procedures  ? Ferritin  ?  Standing Status:   Future  ?  Number of Occurrences:   1  ?  Standing Expiration Date:   05/22/2022  ? Iron and TIBC  ?  Standing Status:   Future  ?  Number of Occurrences:   1  ?  Standing Expiration Date:   11/20/2022  ? CBC with Differential/Platelet  ?  Standing Status:   Future  ?  Number of Occurrences:   1  ?  Standing Expiration Date:   11/20/2022  ? Technologist smear review  ?  Standing Status:   Future  ?  Number of Occurrences:   1  ?  Standing Expiration Date:   11/20/2022  ? BCR-ABL1 FISH  ?  Standing Status:   Future  ?  Number of Occurrences:   1  ?  Standing Expiration Date:   11/20/2022  ? Carbon monoxide, blood (performed at ref lab)  ?  Standing Status:   Future  ?  Number of Occurrences:   1  ?  Standing Expiration Date:   11/20/2022  ? Erythropoietin  ?  Standing Status:   Future  ?  Number of Occurrences:   1  ?  Standing Expiration Date:   11/20/2022  ? Miscellaneous [facility] test (send-out)  ?  Standing Status:   Future  ?  Number of Occurrences:   1  ?   Standing Expiration Date:   11/20/2022  ?  Order Specific Question:   Test name / description:  ?  Answer:   JAk2 V617F with reflex to EX 12-15/CARL/MPL  [id number]  ?  ?All questions were answered. The patient knows to

## 2021-11-20 LAB — CARBON MONOXIDE, BLOOD (PERFORMED AT REF LAB): Carbon Monoxide, Blood: 2.8 % (ref 0.0–3.6)

## 2021-11-21 LAB — ERYTHROPOIETIN: Erythropoietin: 8.6 m[IU]/mL (ref 2.6–18.5)

## 2021-11-25 LAB — BCR-ABL1 FISH
Cells Analyzed: 200
Cells Counted: 200

## 2021-11-27 LAB — MISC LABCORP TEST (SEND OUT): Labcorp test code: 489555

## 2021-12-17 ENCOUNTER — Inpatient Hospital Stay: Payer: BC Managed Care – PPO | Attending: Oncology | Admitting: Oncology

## 2022-01-29 ENCOUNTER — Other Ambulatory Visit: Payer: Self-pay | Admitting: Internal Medicine

## 2022-01-29 DIAGNOSIS — Z1231 Encounter for screening mammogram for malignant neoplasm of breast: Secondary | ICD-10-CM

## 2022-01-29 DIAGNOSIS — N951 Menopausal and female climacteric states: Secondary | ICD-10-CM

## 2022-03-02 ENCOUNTER — Other Ambulatory Visit: Payer: BC Managed Care – PPO

## 2022-03-02 ENCOUNTER — Inpatient Hospital Stay: Admission: RE | Admit: 2022-03-02 | Payer: BC Managed Care – PPO | Source: Ambulatory Visit

## 2022-03-23 ENCOUNTER — Other Ambulatory Visit: Payer: BC Managed Care – PPO

## 2022-03-23 ENCOUNTER — Inpatient Hospital Stay: Admission: RE | Admit: 2022-03-23 | Payer: BC Managed Care – PPO | Source: Ambulatory Visit

## 2022-06-16 ENCOUNTER — Encounter: Payer: Self-pay | Admitting: Oncology

## 2022-06-16 ENCOUNTER — Inpatient Hospital Stay: Payer: BC Managed Care – PPO | Attending: Oncology | Admitting: Oncology

## 2022-06-16 DIAGNOSIS — Z148 Genetic carrier of other disease: Secondary | ICD-10-CM

## 2022-06-16 DIAGNOSIS — D751 Secondary polycythemia: Secondary | ICD-10-CM

## 2022-06-16 NOTE — Progress Notes (Signed)
Pt contacted for Mychart visit. No new concerns voiced.  

## 2022-06-17 NOTE — Progress Notes (Signed)
HEMATOLOGY-ONCOLOGY TeleHEALTH VISIT PROGRESS NOTE  I connected with Madeline Zimmerman on 06/17/22  at  2:45 PM EST by video enabled telemedicine visit and verified that I am speaking with the correct person using two identifiers. I discussed the limitations, risks, security and privacy concerns of performing an evaluation and management service by telemedicine and the availability of in-person appointments. The patient expressed understanding and agreed to proceed.   Other persons participating in the visit and their role in the encounter:  None  Patient's location: Home  Provider's location: office Chief Complaint:    INTERVAL HISTORY Madeline Zimmerman is a 61 y.o. female who has above history reviewed by me today presents for follow up visit for management of  Problems and complaints are listed below:   Review of Systems - Oncology  Past Medical History:  Diagnosis Date   Anxiety and depression    Chronic pain    With narcotic dependence   DDD (degenerative disc disease), cervical    Graves disease    HTN (hypertension)    Polycythemia    Past Surgical History:  Procedure Laterality Date   REDUCTION MAMMAPLASTY  2012   revision breast reduction  2013    Family History  Problem Relation Age of Onset   Hypertension Mother    Heart failure Father     Social History   Socioeconomic History   Marital status: Widowed    Spouse name: Not on file   Number of children: Not on file   Years of education: Not on file   Highest education level: Not on file  Occupational History   Not on file  Tobacco Use   Smoking status: Former   Smokeless tobacco: Never  Substance and Sexual Activity   Alcohol use: No    Alcohol/week: 0.0 standard drinks of alcohol   Drug use: No   Sexual activity: Not Currently  Other Topics Concern   Not on file  Social History Narrative   Not on file   Social Determinants of Health   Financial Resource Strain: Not on file  Food Insecurity: Not on file   Transportation Needs: Not on file  Physical Activity: Not on file  Stress: Not on file  Social Connections: Not on file  Intimate Partner Violence: Not on file    Current Outpatient Medications on File Prior to Visit  Medication Sig Dispense Refill   bisoprolol-hydrochlorothiazide (ZIAC) 5-6.25 MG tablet Take 1 tablet by mouth daily.     buPROPion (WELLBUTRIN XL) 150 MG 24 hr tablet Take 150 mg by mouth every morning.     DULoxetine (CYMBALTA) 60 MG capsule DULoxetine HCl 60 MG Oral Capsule Delayed Release Particles QTY: 90  Days: 90 Refills: 0  Written: 05/15/21 Patient Instructions:     fluticasone (FLONASE) 50 MCG/ACT nasal spray Place into the nose.     gabapentin (NEURONTIN) 300 MG capsule Gabapentin 300 MG Oral Capsule QTY: 30 capsule Days: 30 Refills: 3  Written: 07/30/21 Patient Instructions: 1 every bedtime     ibuprofen (ADVIL) 200 MG tablet Take 200 mg by mouth.     meloxicam (MOBIC) 7.5 MG tablet TAKE 1 TABLET BY MOUTH EVERY DAY     omeprazole (PRILOSEC) 40 MG capsule Omeprazole 40 MG Oral Capsule Delayed Release QTY: 90  Days: 90 Refills: 0  Written: 04/05/21 Patient Instructions:     QUEtiapine (SEROQUEL) 25 MG tablet Take 1 tablet by mouth at bedtime.     rosuvastatin (CRESTOR) 10 MG tablet Rosuvastatin Calcium  10 MG Oral Tablet QTY: 90 tablet Days: 90 Refills: 3  Written: 06/01/21 Patient Instructions: take one tablet by mouth once daily     esomeprazole (NEXIUM) 20 MG capsule Take 20 mg by mouth daily at 12 noon. (Patient not taking: Reported on 06/16/2022)     estradiol (VIVELLE-DOT) 0.1 MG/24HR patch Place onto the skin. (Patient not taking: Reported on 06/16/2022)     HYDROcodone-acetaminophen (NORCO) 10-325 MG tablet Take 1 tablet by mouth 3 (three) times daily as needed. For chronic pain To last for 30 days from fill date. To fill on or after: 09/29/17, 10/29/17, 11/27/17 (Patient not taking: Reported on 06/16/2022) 90 tablet 0   LYRICA 75 MG capsule Take 75 mg by mouth 3  (three) times daily. (Patient not taking: Reported on 06/16/2022)  5   norethindrone (AYGESTIN) 5 MG tablet Take by mouth. (Patient not taking: Reported on 11/19/2021)     PARoxetine (PAXIL) 20 MG tablet TAKE 1 TABLET (20 MG TOTAL) BY MOUTH ONCE DAILY. (Patient not taking: Reported on 11/19/2021)     traMADol (ULTRAM) 50 MG tablet traMADol HCl 50 MG Oral Tablet QTY: 30 tablet Days: 30 Refills: 1  Written: 10/29/21 Patient Instructions: 1 po qd prn only     No current facility-administered medications on file prior to visit.    Allergies  Allergen Reactions   Bacitracin Itching and Rash   Neosporin  [Neomycin-Bacitracin Zn-Polymyx] Rash       Observations/Objective: There were no vitals filed for this visit. There is no height or weight on file to calculate BMI.  Physical Exam  CBC    Component Value Date/Time   WBC 8.2 11/19/2021 1017   RBC 5.47 (H) 11/19/2021 1017   HGB 16.2 (H) 11/19/2021 1017   HCT 49.7 (H) 11/19/2021 1017   PLT 321 11/19/2021 1017   MCV 90.9 11/19/2021 1017   MCH 29.6 11/19/2021 1017   MCHC 32.6 11/19/2021 1017   RDW 13.3 11/19/2021 1017   LYMPHSABS 2.7 11/19/2021 1017   MONOABS 0.6 11/19/2021 1017   EOSABS 0.2 11/19/2021 1017   BASOSABS 0.1 11/19/2021 1017    CMP  No results found for: "NA", "K", "CL", "CO2", "GLUCOSE", "BUN", "CREATININE", "CALCIUM", "PROT", "ALBUMIN", "AST", "ALT", "ALKPHOS", "BILITOT", "GFRNONAA", "GFRAA"   ASSESSMENT & PLAN:   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 Patient asymptomatic.   Labs are reviewed and discussed with patient. No evidence of iron overload.  Recommend avoid iron supplementation, avoid alcohol.   Erythrocytosis #Chronic erythrocytosis, Likely secondary.  JAK2 V617F mutation negative, with reflex to other mutations CALR, MPL, JAK 2 Ex 12-15 mutations negative. BCR ABL1 FISH negative.  Recommend sleep study.   Orders Placed This Encounter  Procedures   CBC with  Differential/Platelet    Standing Status:   Future    Standing Expiration Date:   06/17/2023   Comprehensive metabolic panel    Standing Status:   Future    Standing Expiration Date:   06/16/2023   Iron and TIBC    Standing Status:   Future    Standing Expiration Date:   06/17/2023   Ferritin    Standing Status:   Future    Standing Expiration Date:   06/17/2023   I discussed the assessment and treatment plan with the patient. The patient was provided an opportunity to ask questions and all were answered. The patient agreed with the plan and demonstrated an understanding of the instructions. The patient was advised to call back or seek an in-person  evaluation if the symptoms worsen or if the condition fails to improve as anticipated.   Follow up in 12 months.   Rickard Patience, MD, PhD [facility] Hematology Oncology 06/16/2022

## 2022-06-17 NOTE — Assessment & Plan Note (Signed)
#  Heterozygous hemochromatosis, H63D mutation. Patient asymptomatic.   Labs are reviewed and discussed with patient. No evidence of iron overload.  Recommend avoid iron supplementation, avoid alcohol.

## 2022-06-17 NOTE — Assessment & Plan Note (Signed)
#  Chronic erythrocytosis, Likely secondary.  JAK2 V617F mutation negative, with reflex to other mutations CALR, MPL, JAK 2 Ex 12-15 mutations negative. BCR ABL1 FISH negative.  Recommend sleep study.

## 2022-07-22 LAB — COLOGUARD: COLOGUARD: NEGATIVE

## 2022-07-27 ENCOUNTER — Inpatient Hospital Stay: Admission: RE | Admit: 2022-07-27 | Payer: BC Managed Care – PPO | Source: Ambulatory Visit

## 2022-07-27 ENCOUNTER — Other Ambulatory Visit: Payer: BC Managed Care – PPO

## 2022-08-05 ENCOUNTER — Inpatient Hospital Stay: Admission: RE | Admit: 2022-08-05 | Payer: BC Managed Care – PPO | Source: Ambulatory Visit

## 2022-08-05 ENCOUNTER — Other Ambulatory Visit: Payer: BC Managed Care – PPO

## 2022-08-12 ENCOUNTER — Other Ambulatory Visit: Payer: Self-pay | Admitting: Family

## 2022-08-19 ENCOUNTER — Other Ambulatory Visit: Payer: Self-pay | Admitting: Internal Medicine

## 2022-08-19 DIAGNOSIS — F411 Generalized anxiety disorder: Secondary | ICD-10-CM

## 2022-08-31 ENCOUNTER — Ambulatory Visit: Payer: BC Managed Care – PPO | Admitting: Internal Medicine

## 2022-09-07 ENCOUNTER — Encounter: Payer: Self-pay | Admitting: Internal Medicine

## 2022-09-07 ENCOUNTER — Ambulatory Visit (INDEPENDENT_AMBULATORY_CARE_PROVIDER_SITE_OTHER): Payer: BC Managed Care – PPO | Admitting: Internal Medicine

## 2022-09-07 VITALS — BP 124/68 | HR 86 | Ht 68.0 in | Wt 212.0 lb

## 2022-09-07 DIAGNOSIS — F32A Depression, unspecified: Secondary | ICD-10-CM

## 2022-09-07 DIAGNOSIS — E782 Mixed hyperlipidemia: Secondary | ICD-10-CM

## 2022-09-07 DIAGNOSIS — I10 Essential (primary) hypertension: Secondary | ICD-10-CM

## 2022-09-07 DIAGNOSIS — K2100 Gastro-esophageal reflux disease with esophagitis, without bleeding: Secondary | ICD-10-CM

## 2022-09-07 NOTE — Progress Notes (Signed)
Established Patient Office Visit  Subjective:  Patient ID: Madeline Zimmerman, female    DOB: January 17, 1961  Age: 62 y.o. MRN: [id number]  No chief complaint on file.   Patient comes in for her follow-up today.  She is taking all her medications and she is actually feeling better now.  Only rarely she gets a flareup of her pain when she tries to do extra work at the house.  Patient advised to take an extra gabapentin in those cases.  Patient will come back fasting for her blood work. Her Cologuard was negative. Her mammogram scheduled for tomorrow.     Past Medical History:  Diagnosis Date   Anxiety and depression    Chronic pain    With narcotic dependence   DDD (degenerative disc disease), cervical    Graves disease    HTN (hypertension)    Polycythemia     Social History   Socioeconomic History   Marital status: Widowed    Spouse name: Not on file   Number of children: Not on file   Years of education: Not on file   Highest education level: Not on file  Occupational History   Not on file  Tobacco Use   Smoking status: Former   Smokeless tobacco: Never  Substance and Sexual Activity   Alcohol use: No    Alcohol/week: 0.0 standard drinks of alcohol   Drug use: No   Sexual activity: Not Currently  Other Topics Concern   Not on file  Social History Narrative   Not on file   Social Determinants of Health   Financial Resource Strain: Not on file  Food Insecurity: Not on file  Transportation Needs: Not on file  Physical Activity: Not on file  Stress: Not on file  Social Connections: Not on file  Intimate Partner Violence: Not on file    Family History  Problem Relation Age of Onset   Hypertension Mother    Heart failure Father     Allergies  Allergen Reactions   Bacitracin Itching and Rash   Neosporin  [Neomycin-Bacitracin Zn-Polymyx] Rash    Review of Systems  Constitutional:  Negative for chills, fever, malaise/fatigue and weight loss.  HENT: Negative.     Eyes: Negative.   Respiratory: Negative.    Gastrointestinal: Negative.   Genitourinary: Negative.   Musculoskeletal: Negative.   Skin: Negative.   Neurological: Negative.   Endo/Heme/Allergies: Negative.   Psychiatric/Behavioral: Negative.         Objective:   BP 124/68   Pulse 86   Ht '5\' 8"'$  (1.727 m)   Wt 212 lb (96.2 kg)   SpO2 97%   BMI 32.23 kg/m   Vitals:   09/07/22 1121  BP: 124/68  Pulse: 86  Height: '5\' 8"'$  (1.727 m)  Weight: 212 lb (96.2 kg)  SpO2: 97%  BMI (Calculated): 32.24    Physical Exam Vitals and nursing note reviewed.  Constitutional:      Appearance: Normal appearance. She is obese.  HENT:     Head: Normocephalic and atraumatic.  Cardiovascular:     Rate and Rhythm: Normal rate and regular rhythm.     Pulses: Normal pulses.     Heart sounds: Normal heart sounds.  Pulmonary:     Effort: Pulmonary effort is normal.     Breath sounds: Normal breath sounds.  Abdominal:     General: Abdomen is flat.     Palpations: Abdomen is soft.  Musculoskeletal:  General: Normal range of motion.     Cervical back: Normal range of motion and neck supple.  Skin:    General: Skin is warm.  Neurological:     General: No focal deficit present.     Mental Status: She is alert and oriented to person, place, and time.  Psychiatric:        Mood and Affect: Mood normal.        Behavior: Behavior normal.      No results found for any visits on 09/07/22.  No results found for this or any previous visit (from the past 2160 hour(s)).    Assessment & Plan:  Patient advised to continue all her medications.  She will return fasting for her blood work. Problem List Items Addressed This Visit     Depressive disorder   Essential hypertension, benign - Primary   Relevant Orders   CMP14+EGFR   Mixed hyperlipidemia   Relevant Orders   Lipid Panel w/o Chol/HDL Ratio   Gastroesophageal reflux disease with esophagitis without hemorrhage   Relevant Orders    CBC With Differential    Return in about 3 months (around 12/06/2022).   Total time spent: 30 minutes  Perrin Maltese, MD  09/07/2022

## 2022-09-14 ENCOUNTER — Inpatient Hospital Stay: Admission: RE | Admit: 2022-09-14 | Payer: BC Managed Care – PPO | Source: Ambulatory Visit

## 2022-09-14 ENCOUNTER — Other Ambulatory Visit: Payer: BC Managed Care – PPO

## 2022-10-15 ENCOUNTER — Other Ambulatory Visit: Payer: Self-pay | Admitting: Family

## 2022-10-15 NOTE — Telephone Encounter (Signed)
I have discussed this plan of care with Dr. Neelam Khan  

## 2022-11-04 ENCOUNTER — Other Ambulatory Visit: Payer: Self-pay | Admitting: Internal Medicine

## 2022-11-04 DIAGNOSIS — I10 Essential (primary) hypertension: Secondary | ICD-10-CM

## 2022-11-13 ENCOUNTER — Other Ambulatory Visit: Payer: Self-pay | Admitting: Family

## 2022-12-06 ENCOUNTER — Encounter: Payer: Self-pay | Admitting: Internal Medicine

## 2022-12-06 ENCOUNTER — Ambulatory Visit (INDEPENDENT_AMBULATORY_CARE_PROVIDER_SITE_OTHER): Payer: BC Managed Care – PPO | Admitting: Internal Medicine

## 2022-12-06 VITALS — BP 128/72 | HR 88 | Ht 68.0 in | Wt 200.0 lb

## 2022-12-06 DIAGNOSIS — E782 Mixed hyperlipidemia: Secondary | ICD-10-CM | POA: Diagnosis not present

## 2022-12-06 DIAGNOSIS — I10 Essential (primary) hypertension: Secondary | ICD-10-CM | POA: Diagnosis not present

## 2022-12-06 DIAGNOSIS — K2100 Gastro-esophageal reflux disease with esophagitis, without bleeding: Secondary | ICD-10-CM | POA: Diagnosis not present

## 2022-12-06 DIAGNOSIS — M62830 Muscle spasm of back: Secondary | ICD-10-CM | POA: Diagnosis not present

## 2022-12-06 MED ORDER — TIZANIDINE HCL 2 MG PO CAPS
2.0000 mg | ORAL_CAPSULE | Freq: Two times a day (BID) | ORAL | 0 refills | Status: DC
Start: 2022-12-06 — End: 2024-03-08

## 2022-12-06 MED ORDER — DICLOFENAC SODIUM 1 % EX GEL
4.0000 g | Freq: Two times a day (BID) | CUTANEOUS | 1 refills | Status: DC
Start: 2022-12-06 — End: 2024-03-08

## 2022-12-06 NOTE — Progress Notes (Signed)
Established Patient Office Visit  Subjective:  Patient ID: Madeline Zimmerman, female    DOB: Feb 28, 1961  Age: 62 y.o. MRN: [id number]  Chief Complaint  Patient presents with   Follow-up    3 month follow up & pulled muscles    Patient comes in with complaints of mid back pain today, mostly on the left side.  Patient reports that she has a new puppy and now has to be more active with him as with playing, taking him out on walks, and cleaning after him.  As a result she pulled a muscle of her back.  It happened few days ago and she took her medications as Celebrex and tramadol but thinks it is not helping.  There is no bruising in that area.  She is wearing her back brace.  Has some difficulty with movement and the area is tender. Will start a muscle relaxer, and patient advised to use Voltaren gel over the area and a heating pad.  Patient advised to avoid movements which aggravate the pain. She missed her mammogram again.    No other concerns at this time.   Past Medical History:  Diagnosis Date   Anxiety and depression    Chronic pain    With narcotic dependence   DDD (degenerative disc disease), cervical    Graves disease    HTN (hypertension)    Polycythemia     Past Surgical History:  Procedure Laterality Date   REDUCTION MAMMAPLASTY  2012   revision breast reduction  2013    Social History   Socioeconomic History   Marital status: Widowed    Spouse name: Not on file   Number of children: Not on file   Years of education: Not on file   Highest education level: Not on file  Occupational History   Not on file  Tobacco Use   Smoking status: Former   Smokeless tobacco: Never  Substance and Sexual Activity   Alcohol use: No    Alcohol/week: 0.0 standard drinks of alcohol   Drug use: No   Sexual activity: Not Currently  Other Topics Concern   Not on file  Social History Narrative   Not on file   Social Determinants of Health   Financial Resource Strain: Not on  file  Food Insecurity: Not on file  Transportation Needs: Not on file  Physical Activity: Not on file  Stress: Not on file  Social Connections: Not on file  Intimate Partner Violence: Not on file    Family History  Problem Relation Age of Onset   Hypertension Mother    Heart failure Father     Allergies  Allergen Reactions   Bacitracin Itching and Rash   Neosporin  [Neomycin-Bacitracin Zn-Polymyx] Rash    Review of Systems  Constitutional:  Negative for diaphoresis, fever, malaise/fatigue and weight loss.  HENT:  Negative for hearing loss, sinus pain, sore throat and tinnitus.   Eyes: Negative.   Respiratory:  Negative for cough, shortness of breath and wheezing.   Cardiovascular:  Negative for chest pain and leg swelling.  Gastrointestinal:  Negative for abdominal pain, blood in stool, constipation, heartburn, melena, nausea and vomiting.  Genitourinary:  Negative for dysuria, flank pain, hematuria and urgency.  Musculoskeletal:  Positive for back pain and myalgias. Negative for falls, joint pain and neck pain.  Neurological:  Negative for dizziness, tingling, speech change, seizures, loss of consciousness, weakness and headaches.  Psychiatric/Behavioral:  Positive for depression. Negative for hallucinations, substance abuse and  suicidal ideas. The patient is not nervous/anxious and does not have insomnia.        Objective:   BP 128/72   Pulse 88   Ht 5\' 8"  (1.727 m)   Wt 200 lb (90.7 kg)   SpO2 95%   BMI 30.41 kg/m   Vitals:   12/06/22 1026  BP: 128/72  Pulse: 88  Height: 5\' 8"  (1.727 m)  Weight: 200 lb (90.7 kg)  SpO2: 95%  BMI (Calculated): 30.42    Physical Exam Vitals and nursing note reviewed.  Constitutional:      Appearance: Normal appearance. She is obese.  HENT:     Head: Normocephalic and atraumatic.  Cardiovascular:     Rate and Rhythm: Normal rate and regular rhythm.     Pulses: Normal pulses.     Heart sounds: No murmur  heard. Pulmonary:     Effort: Pulmonary effort is normal.     Breath sounds: Normal breath sounds. No wheezing, rhonchi or rales.  Chest:     Chest wall: No tenderness.  Abdominal:     General: Bowel sounds are normal. There is no distension.     Tenderness: There is no abdominal tenderness. There is no left CVA tenderness, guarding or rebound.  Musculoskeletal:        General: Tenderness (left mid paraspinal area) present. No swelling, deformity or signs of injury.     Cervical back: Normal range of motion and neck supple. No rigidity or tenderness.     Right lower leg: No edema.     Left lower leg: No edema.  Lymphadenopathy:     Cervical: No cervical adenopathy.  Skin:    Findings: No bruising, lesion or rash.  Neurological:     General: No focal deficit present.     Mental Status: She is alert and oriented to person, place, and time.  Psychiatric:        Mood and Affect: Mood normal.        Behavior: Behavior normal.      No results found for any visits on 12/06/22.  No results found for this or any previous visit (from the past 2160 hour(s)).    Assessment & Plan:  Patient to start the muscle relaxer.  Heating pad, and Voltaren gel. Problem List Items Addressed This Visit     Essential hypertension, benign   Mixed hyperlipidemia   Gastroesophageal reflux disease with esophagitis without hemorrhage   Muscle spasm of back - Primary   Relevant Medications   tizanidine (ZANAFLEX) 2 MG capsule   diclofenac Sodium (VOLTAREN ARTHRITIS PAIN) 1 % GEL    Return in about 1 week (around 12/13/2022).   Total time spent: 30 minutes  Margaretann Loveless, MD  12/06/2022   This document may have been prepared by [facility] Voice Recognition software and as such may include unintentional dictation errors.

## 2022-12-07 ENCOUNTER — Ambulatory Visit: Payer: BC Managed Care – PPO | Admitting: Internal Medicine

## 2022-12-13 ENCOUNTER — Other Ambulatory Visit: Payer: Self-pay | Admitting: Family

## 2022-12-15 ENCOUNTER — Other Ambulatory Visit: Payer: BC Managed Care – PPO

## 2022-12-15 DIAGNOSIS — E782 Mixed hyperlipidemia: Secondary | ICD-10-CM

## 2022-12-15 DIAGNOSIS — I10 Essential (primary) hypertension: Secondary | ICD-10-CM

## 2022-12-16 ENCOUNTER — Encounter: Payer: Self-pay | Admitting: Internal Medicine

## 2022-12-16 ENCOUNTER — Ambulatory Visit (INDEPENDENT_AMBULATORY_CARE_PROVIDER_SITE_OTHER): Payer: BC Managed Care – PPO | Admitting: Internal Medicine

## 2022-12-16 VITALS — BP 120/74 | HR 81 | Ht 68.0 in | Wt 205.0 lb

## 2022-12-16 DIAGNOSIS — I10 Essential (primary) hypertension: Secondary | ICD-10-CM

## 2022-12-16 DIAGNOSIS — G8929 Other chronic pain: Secondary | ICD-10-CM

## 2022-12-16 DIAGNOSIS — K2100 Gastro-esophageal reflux disease with esophagitis, without bleeding: Secondary | ICD-10-CM | POA: Diagnosis not present

## 2022-12-16 DIAGNOSIS — M545 Low back pain, unspecified: Secondary | ICD-10-CM | POA: Diagnosis not present

## 2022-12-16 DIAGNOSIS — F32A Depression, unspecified: Secondary | ICD-10-CM

## 2022-12-16 DIAGNOSIS — E782 Mixed hyperlipidemia: Secondary | ICD-10-CM

## 2022-12-16 LAB — LIPID PANEL
Chol/HDL Ratio: 4 ratio (ref 0.0–4.4)
Cholesterol, Total: 233 mg/dL — ABNORMAL HIGH (ref 100–199)
HDL: 58 mg/dL (ref >39)
LDL Chol Calc (NIH): 124 mg/dL — ABNORMAL HIGH (ref 0–99)
Triglycerides: 291 mg/dL — ABNORMAL HIGH (ref 0–149)
VLDL Cholesterol Cal: 51 mg/dL — ABNORMAL HIGH (ref 5–40)

## 2022-12-16 LAB — CMP14+EGFR
ALT: 11 IU/L (ref 0–32)
AST: 17 IU/L (ref 0–40)
Albumin/Globulin Ratio: 1.9 (ref 1.2–2.2)
Albumin: 4.4 g/dL (ref 3.9–4.9)
Alkaline Phosphatase: 109 IU/L (ref 44–121)
BUN/Creatinine Ratio: 22 (ref 12–28)
BUN: 17 mg/dL (ref 8–27)
Bilirubin Total: 0.7 mg/dL (ref 0.0–1.2)
CO2: 26 mmol/L (ref 20–29)
Calcium: 10.3 mg/dL (ref 8.7–10.3)
Chloride: 103 mmol/L (ref 96–106)
Creatinine, Ser: 0.77 mg/dL (ref 0.57–1.00)
Globulin, Total: 2.3 g/dL (ref 1.5–4.5)
Glucose: 97 mg/dL (ref 70–99)
Potassium: 4.9 mmol/L (ref 3.5–5.2)
Sodium: 141 mmol/L (ref 134–144)
Total Protein: 6.7 g/dL (ref 6.0–8.5)
eGFR: 86 mL/min/{1.73_m2} (ref >59)

## 2022-12-16 LAB — CBC WITH DIFFERENTIAL
Basophils Absolute: 0.1 10*3/uL (ref 0.0–0.2)
Basos: 1 %
EOS (ABSOLUTE): 0.4 10*3/uL (ref 0.0–0.4)
Eos: 5 %
Hematocrit: 46.9 % — ABNORMAL HIGH (ref 34.0–46.6)
Hemoglobin: 15.4 g/dL (ref 11.1–15.9)
Immature Grans (Abs): 0 10*3/uL (ref 0.0–0.1)
Immature Granulocytes: 0 %
Lymphocytes Absolute: 2.5 10*3/uL (ref 0.7–3.1)
Lymphs: 27 %
MCH: 29.9 pg (ref 26.6–33.0)
MCHC: 32.8 g/dL (ref 31.5–35.7)
MCV: 91 fL (ref 79–97)
Monocytes Absolute: 0.7 10*3/uL (ref 0.1–0.9)
Monocytes: 7 %
Neutrophils Absolute: 5.4 10*3/uL (ref 1.4–7.0)
Neutrophils: 60 %
RBC: 5.15 x10E6/uL (ref 3.77–5.28)
RDW: 12.8 % (ref 11.7–15.4)
WBC: 9.1 10*3/uL (ref 3.4–10.8)

## 2022-12-16 MED ORDER — ROSUVASTATIN CALCIUM 20 MG PO TABS
20.0000 mg | ORAL_TABLET | Freq: Every day | ORAL | 11 refills | Status: DC
Start: 2022-12-16 — End: 2023-10-31

## 2022-12-16 NOTE — Progress Notes (Signed)
Established Patient Office Visit  Subjective:  Patient ID: Madeline Zimmerman, female    DOB: September 14, 1960  Age: 62 y.o. MRN: [id number]  Chief Complaint  Patient presents with   Follow-up    10 day follow up    Patient comes in to discuss her lab results as well as a follow-up of her upper back muscle spasm.  She has taken the muscle relaxer and used Voltaren gel so the spasm has resolved.  However patient continues to have very chronic low back pain for which she thinks she has to take a tramadol every day.  Today she agrees to see a specialist.  She would rather go to an orthopedic surgeon for her back pain.  And then we will consider a pain clinic.  Referral has been sent. Her LDL cholesterol is still high on Crestor 10 mg/day.  Patient advised to increase her diet control at the same time her Crestor dose will be raised to 20 mg/day.    No other concerns at this time.   Past Medical History:  Diagnosis Date   Anxiety and depression    Chronic pain    With narcotic dependence   DDD (degenerative disc disease), cervical    Graves disease    HTN (hypertension)    Polycythemia     Past Surgical History:  Procedure Laterality Date   REDUCTION MAMMAPLASTY  2012   revision breast reduction  2013    Social History   Socioeconomic History   Marital status: Widowed    Spouse name: Not on file   Number of children: Not on file   Years of education: Not on file   Highest education level: Not on file  Occupational History   Not on file  Tobacco Use   Smoking status: Former   Smokeless tobacco: Never  Substance and Sexual Activity   Alcohol use: No    Alcohol/week: 0.0 standard drinks of alcohol   Drug use: No   Sexual activity: Not Currently  Other Topics Concern   Not on file  Social History Narrative   Not on file   Social Determinants of Health   Financial Resource Strain: Not on file  Food Insecurity: Not on file  Transportation Needs: Not on file  Physical  Activity: Not on file  Stress: Not on file  Social Connections: Not on file  Intimate Partner Violence: Not on file    Family History  Problem Relation Age of Onset   Hypertension Mother    Heart failure Father     Allergies  Allergen Reactions   Bacitracin Itching and Rash   Neosporin  [Neomycin-Bacitracin Zn-Polymyx] Rash    Review of Systems  Constitutional:  Negative for chills, diaphoresis, fever, malaise/fatigue and weight loss.  HENT: Negative.    Eyes: Negative.   Respiratory:  Negative for hemoptysis, shortness of breath and wheezing.   Cardiovascular:  Negative for chest pain, palpitations, leg swelling and PND.  Gastrointestinal:  Negative for abdominal pain, blood in stool, diarrhea, heartburn, melena, nausea and vomiting.  Genitourinary:  Negative for dysuria, flank pain, frequency, hematuria and urgency.  Musculoskeletal:  Positive for back pain and myalgias. Negative for falls, joint pain and neck pain.  Skin: Negative.   Neurological:  Negative for dizziness, tremors, sensory change, focal weakness, loss of consciousness, weakness and headaches.  Psychiatric/Behavioral:  Negative for depression and memory loss. The patient has insomnia.        Objective:   BP 120/74   Pulse  81   Ht 5\' 8"  (1.727 m)   Wt 205 lb (93 kg)   SpO2 96%   BMI 31.17 kg/m   Vitals:   12/16/22 1114  BP: 120/74  Pulse: 81  Height: 5\' 8"  (1.727 m)  Weight: 205 lb (93 kg)  SpO2: 96%  BMI (Calculated): 31.18    Physical Exam Vitals and nursing note reviewed.  Constitutional:      Appearance: Normal appearance.  HENT:     Head: Normocephalic and atraumatic.     Right Ear: Tympanic membrane normal.     Left Ear: Tympanic membrane normal.  Cardiovascular:     Rate and Rhythm: Normal rate and regular rhythm.     Pulses: Normal pulses.  Pulmonary:     Effort: Pulmonary effort is normal.     Breath sounds: No wheezing, rhonchi or rales.  Abdominal:     General: Bowel  sounds are normal.     Palpations: Abdomen is soft.  Musculoskeletal:        General: Normal range of motion.     Cervical back: Normal range of motion.     Right lower leg: No edema.     Left lower leg: No edema.  Skin:    General: Skin is warm and dry.  Neurological:     General: No focal deficit present.     Mental Status: She is alert and oriented to person, place, and time.  Psychiatric:        Mood and Affect: Mood normal.        Behavior: Behavior normal.      No results found for any visits on 12/16/22.  Recent Results (from the past 2160 hour(s))  CBC With Differential     Status: Abnormal   Collection Time: 12/15/22 10:19 AM  Result Value Ref Range   WBC 9.1 3.4 - 10.8 x10E3/uL   RBC 5.15 3.77 - 5.28 x10E6/uL   Hemoglobin 15.4 11.1 - 15.9 g/dL   Hematocrit 09.8 (H) 11.9 - 46.6 %   MCV 91 79 - 97 fL   MCH 29.9 26.6 - 33.0 pg   MCHC 32.8 31.5 - 35.7 g/dL   RDW 14.7 82.9 - 56.2 %   Neutrophils 60 Not Estab. %   Lymphs 27 Not Estab. %   Monocytes 7 Not Estab. %   Eos 5 Not Estab. %   Basos 1 Not Estab. %   Neutrophils Absolute 5.4 1.4 - 7.0 x10E3/uL   Lymphocytes Absolute 2.5 0.7 - 3.1 x10E3/uL   Monocytes Absolute 0.7 0.1 - 0.9 x10E3/uL   EOS (ABSOLUTE) 0.4 0.0 - 0.4 x10E3/uL   Basophils Absolute 0.1 0.0 - 0.2 x10E3/uL   Immature Granulocytes 0 Not Estab. %   Immature Grans (Abs) 0.0 0.0 - 0.1 x10E3/uL  CMP14+EGFR     Status: None   Collection Time: 12/15/22 10:19 AM  Result Value Ref Range   Glucose 97 70 - 99 mg/dL   BUN 17 8 - 27 mg/dL   Creatinine, Ser 1.30 0.57 - 1.00 mg/dL   eGFR 86 >86 VH/QIO/9.62   BUN/Creatinine Ratio 22 12 - 28   Sodium 141 134 - 144 mmol/L   Potassium 4.9 3.5 - 5.2 mmol/L   Chloride 103 96 - 106 mmol/L   CO2 26 20 - 29 mmol/L   Calcium 10.3 8.7 - 10.3 mg/dL   Total Protein 6.7 6.0 - 8.5 g/dL   Albumin 4.4 3.9 - 4.9 g/dL   Globulin, Total 2.3 1.5 -  4.5 g/dL   Albumin/Globulin Ratio 1.9 1.2 - 2.2   Bilirubin Total 0.7 0.0  - 1.2 mg/dL   Alkaline Phosphatase 109 44 - 121 IU/L   AST 17 0 - 40 IU/L   ALT 11 0 - 32 IU/L  Lipid panel     Status: Abnormal   Collection Time: 12/15/22 10:19 AM  Result Value Ref Range   Cholesterol, Total 233 (H) 100 - 199 mg/dL   Triglycerides 098 (H) 0 - 149 mg/dL   HDL 58 >11 mg/dL   VLDL Cholesterol Cal 51 (H) 5 - 40 mg/dL   LDL Chol Calc (NIH) 914 (H) 0 - 99 mg/dL   Chol/HDL Ratio 4.0 0.0 - 4.4 ratio    Comment:                                   T. Chol/HDL Ratio                                             Men  Women                               1/2 Avg.Risk  3.4    3.3                                   Avg.Risk  5.0    4.4                                2X Avg.Risk  9.6    7.1                                3X Avg.Risk 23.4   11.0       Assessment & Plan:  Patient will continue all her current medications as such.  Referral sent for to orthopedic for chronic low back pain. She will increase her Crestor to 20 mg/day. Problem List Items Addressed This Visit     Depressive disorder   Essential hypertension, benign   Relevant Medications   rosuvastatin (CRESTOR) 20 MG tablet   Chronic midline low back pain without sciatica   Relevant Orders   Ambulatory referral to Orthopedic Surgery   Mixed hyperlipidemia - Primary   Relevant Medications   rosuvastatin (CRESTOR) 20 MG tablet   Gastroesophageal reflux disease with esophagitis without hemorrhage    Return in about 4 months (around 04/17/2023).   Total time spent: 30 minutes  Margaretann Loveless, MD  12/16/2022   This document may have been prepared by [facility] Voice Recognition software and as such may include unintentional dictation errors.

## 2023-04-14 ENCOUNTER — Other Ambulatory Visit: Payer: Self-pay | Admitting: Internal Medicine

## 2023-04-18 ENCOUNTER — Ambulatory Visit: Payer: BC Managed Care – PPO | Admitting: Internal Medicine

## 2023-04-18 ENCOUNTER — Encounter: Payer: Self-pay | Admitting: Internal Medicine

## 2023-04-28 ENCOUNTER — Other Ambulatory Visit: Payer: Self-pay | Admitting: Internal Medicine

## 2023-04-28 DIAGNOSIS — K2100 Gastro-esophageal reflux disease with esophagitis, without bleeding: Secondary | ICD-10-CM

## 2023-04-28 DIAGNOSIS — F32A Depression, unspecified: Secondary | ICD-10-CM

## 2023-06-17 ENCOUNTER — Inpatient Hospital Stay: Payer: BC Managed Care – PPO | Attending: Oncology

## 2023-06-20 ENCOUNTER — Inpatient Hospital Stay: Payer: BC Managed Care – PPO | Admitting: Oncology

## 2023-06-20 ENCOUNTER — Other Ambulatory Visit: Payer: Self-pay

## 2023-06-20 ENCOUNTER — Telehealth: Payer: Self-pay | Admitting: Oncology

## 2023-06-20 DIAGNOSIS — D751 Secondary polycythemia: Secondary | ICD-10-CM

## 2023-06-20 DIAGNOSIS — Z148 Genetic carrier of other disease: Secondary | ICD-10-CM

## 2023-06-20 NOTE — Telephone Encounter (Signed)
Per RN, pt missed labs prior to mychart visit today. And all appts will need to be r/s  I called pt and left the callback number and explained that labs will need to be r/s and the mychart visit for today will  need to be r/s as well.

## 2023-06-23 ENCOUNTER — Other Ambulatory Visit: Payer: Self-pay | Admitting: Internal Medicine

## 2023-07-27 ENCOUNTER — Other Ambulatory Visit: Payer: Self-pay | Admitting: Internal Medicine

## 2023-07-27 DIAGNOSIS — I10 Essential (primary) hypertension: Secondary | ICD-10-CM

## 2023-08-11 ENCOUNTER — Other Ambulatory Visit: Payer: Self-pay | Admitting: Internal Medicine

## 2023-08-11 DIAGNOSIS — F411 Generalized anxiety disorder: Secondary | ICD-10-CM

## 2023-09-22 ENCOUNTER — Other Ambulatory Visit: Payer: Self-pay | Admitting: Family

## 2023-10-27 ENCOUNTER — Other Ambulatory Visit: Payer: Self-pay | Admitting: Internal Medicine

## 2023-10-27 DIAGNOSIS — E782 Mixed hyperlipidemia: Secondary | ICD-10-CM

## 2023-10-27 DIAGNOSIS — F411 Generalized anxiety disorder: Secondary | ICD-10-CM

## 2023-11-03 ENCOUNTER — Ambulatory Visit: Payer: Self-pay | Admitting: Internal Medicine

## 2023-12-05 ENCOUNTER — Other Ambulatory Visit: Payer: Self-pay | Admitting: Internal Medicine

## 2024-01-02 ENCOUNTER — Other Ambulatory Visit: Payer: Self-pay | Admitting: Family

## 2024-01-26 ENCOUNTER — Other Ambulatory Visit: Payer: Self-pay | Admitting: Internal Medicine

## 2024-01-26 ENCOUNTER — Encounter: Payer: Self-pay | Admitting: Internal Medicine

## 2024-01-26 DIAGNOSIS — F32A Depression, unspecified: Secondary | ICD-10-CM

## 2024-02-03 ENCOUNTER — Other Ambulatory Visit: Payer: Self-pay | Admitting: Internal Medicine

## 2024-02-03 DIAGNOSIS — F411 Generalized anxiety disorder: Secondary | ICD-10-CM

## 2024-02-03 DIAGNOSIS — K2100 Gastro-esophageal reflux disease with esophagitis, without bleeding: Secondary | ICD-10-CM

## 2024-03-08 ENCOUNTER — Encounter: Payer: Self-pay | Admitting: Nurse Practitioner

## 2024-03-08 ENCOUNTER — Ambulatory Visit: Payer: Self-pay | Admitting: Nurse Practitioner

## 2024-03-08 ENCOUNTER — Other Ambulatory Visit: Payer: Self-pay | Admitting: Internal Medicine

## 2024-03-08 VITALS — BP 111/73 | HR 81 | Temp 98.7°F | Ht 67.5 in | Wt 216.2 lb

## 2024-03-08 DIAGNOSIS — K2100 Gastro-esophageal reflux disease with esophagitis, without bleeding: Secondary | ICD-10-CM

## 2024-03-08 DIAGNOSIS — E782 Mixed hyperlipidemia: Secondary | ICD-10-CM | POA: Diagnosis not present

## 2024-03-08 DIAGNOSIS — F32A Depression, unspecified: Secondary | ICD-10-CM | POA: Diagnosis not present

## 2024-03-08 DIAGNOSIS — Z8639 Personal history of other endocrine, nutritional and metabolic disease: Secondary | ICD-10-CM | POA: Insufficient documentation

## 2024-03-08 DIAGNOSIS — I10 Essential (primary) hypertension: Secondary | ICD-10-CM | POA: Diagnosis not present

## 2024-03-08 DIAGNOSIS — Z7689 Persons encountering health services in other specified circumstances: Secondary | ICD-10-CM

## 2024-03-08 MED ORDER — DULOXETINE HCL 30 MG PO CPEP: 30.0000 mg | ORAL_CAPSULE | Freq: Every day | ORAL | 0 refills | Status: DC

## 2024-03-08 NOTE — Assessment & Plan Note (Signed)
Chronic.  Controlled.  Continue with current medication regimen.  Will check labs at next visit.  Return to clinic in 1 month for reevaluation.  Call sooner if concerns arise.

## 2024-03-08 NOTE — Assessment & Plan Note (Signed)
 Chronic.  ASCVD risk score at 6%.  Patient is working on diet and exercise.  Continue with Rosuvastatin  20mg  daily.  Will check labs at next visit.

## 2024-03-08 NOTE — Progress Notes (Signed)
 BP 111/73   Pulse 81   Temp 98.7 F (37.1 C) (Oral)   Ht 5' 7.5 (1.715 m)   Wt 216 lb 3.2 oz (98.1 kg)   SpO2 93%   BMI 33.36 kg/m    Subjective:    Patient ID: Madeline Zimmerman, female    DOB: 1960/09/28, 63 y.o.   MRN: [id number]  HPI: Madeline Zimmerman is a 63 y.o. female  Chief Complaint  Patient presents with   Anxiety    Patient states she would like to discuss coming off of her mood medications   Depression   Patient presents to clinic to establish care with new PCP.  Introduced to Publishing rights manager role and practice setting.  All questions answered.  Discussed provider/patient relationship and expectations.  Patient reports a history of HTN, high cholesterol, depression, graves- well controlled, smoked but quit in 25 years ago.  Patient denies a history of: Diabetes, Anxiety, Neurological problems, and Abdominal problems.   HYPERTENSION / HYPERLIPIDEMIA Satisfied with current treatment? yes Duration of hypertension: years BP monitoring frequency: not checking BP range:  BP medication side effects: no Past BP meds: bisoprolol (bystolic) and HCTZ Duration of hyperlipidemia: years Cholesterol medication side effects: no Cholesterol supplements: none Past cholesterol medications: rosuvastatin  (crestor ) Medication compliance: excellent compliance Aspirin: no Recent stressors: no Recurrent headaches: no Visual changes: no Palpitations: no Dyspnea: no Chest pain: no Lower extremity edema: no Dizzy/lightheaded: no  The 10-year ASCVD risk score (Arnett DK, et al., 2019) is: 6%   Values used to calculate the score:     Age: 80 years     Clincally relevant sex: Female     Is Non-Hispanic African American: No     Diabetic: No     Tobacco smoker: No     Systolic Blood Pressure: 111 mmHg     Is BP treated: Yes     HDL Cholesterol: 58 mg/dL     Total Cholesterol: 233 mg/dL   DEPRESSION Patient states she is doing well with her mood.  She would like to wean down on her  antidepressants.  She isn't sure if she needs the medication any longer.   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   Mixed hyperlipidemia 09/07/2022   Gastroesophageal reflux disease with esophagitis without hemorrhage 09/07/2022   Muscle spasm of back 12/06/2022   History of Graves' disease 03/08/2024   Resolved Ambulatory Problems    Diagnosis Date Noted   No Resolved Ambulatory Problems   Past Medical History:  Diagnosis Date   Anxiety and depression    Chronic pain    DDD (degenerative disc disease), cervical    Graves disease    HTN (hypertension)    Hyperlipidemia    Past Surgical History:  Procedure Laterality Date   REDUCTION MAMMAPLASTY  2012   revision breast reduction  2013   Family History  Problem Relation Age of Onset   Hypertension Mother    Heart failure Father    Cancer Brother    Asthma Brother    Diabetes Brother      Review of Systems  Eyes:  Negative  for visual disturbance.  Respiratory:  Negative for cough, chest tightness and shortness of breath.   Cardiovascular:  Negative for chest pain, palpitations and leg swelling.  Neurological:  Negative for dizziness and headaches.  Psychiatric/Behavioral:  Positive for decreased concentration. The patient is not nervous/anxious.     Per HPI unless specifically indicated above     Objective:    BP 111/73   Pulse 81   Temp 98.7 F (37.1 C) (Oral)   Ht 5' 7.5 (1.715 m)   Wt 216 lb 3.2 oz (98.1 kg)   SpO2 93%   BMI 33.36 kg/m   Wt Readings from Last 3 Encounters:   03/08/24 216 lb 3.2 oz (98.1 kg)  12/16/22 205 lb (93 kg)  12/06/22 200 lb (90.7 kg)    Physical Exam Vitals and nursing note reviewed.  Constitutional:      General: She is not in acute distress.    Appearance: Normal appearance. She is not ill-appearing, toxic-appearing or diaphoretic.  HENT:     Head: Normocephalic.     Right Ear: External ear normal.     Left Ear: External ear normal.     Nose: Nose normal.     Mouth/Throat:     Mouth: Mucous membranes are moist.     Pharynx: Oropharynx is clear.  Eyes:     General:        Right eye: No discharge.        Left eye: No discharge.     Extraocular Movements: Extraocular movements intact.     Conjunctiva/sclera: Conjunctivae normal.     Pupils: Pupils are equal, round, and reactive to light.  Cardiovascular:     Rate and Rhythm: Normal rate and regular rhythm.     Heart sounds: No murmur heard. Pulmonary:     Effort: Pulmonary effort is normal. No respiratory distress.     Breath sounds: Normal breath sounds. No wheezing or rales.  Musculoskeletal:     Cervical back: Normal range of motion and neck supple.  Skin:    General: Skin is warm and dry.     Capillary Refill: Capillary refill takes less than 2 seconds.  Neurological:     General: No focal deficit present.     Mental Status: She is alert and oriented to person, place, and time. Mental status is at baseline.  Psychiatric:        Mood and Affect: Mood normal.        Behavior: Behavior normal.        Thought Content: Thought content normal.        Judgment: Judgment normal.     Results for orders placed or performed in visit on 12/15/22  CBC With Differential   Collection Time: 12/15/22 10:19 AM  Result Value Ref Range   WBC 9.1 3.4 - 10.8 x10E3/uL   RBC 5.15 3.77 - 5.28 x10E6/uL   Hemoglobin 15.4 11.1 - 15.9 g/dL   Hematocrit 53.0 (H) 65.9 - 46.6 %   MCV 91 79 - 97 fL   MCH 29.9 26.6 - 33.0 pg   MCHC 32.8 31.5 - 35.7 g/dL   RDW 87.1 88.2 - 84.5 %    Neutrophils 60 Not Estab. %   Lymphs 27 Not Estab. %   Monocytes 7 Not Estab. %   Eos 5 Not Estab. %   Basos 1 Not Estab. %   Neutrophils Absolute 5.4 1.4 - 7.0 x10E3/uL   Lymphocytes Absolute 2.5 0.7 - 3.1 x10E3/uL  Monocytes Absolute 0.7 0.1 - 0.9 x10E3/uL   EOS (ABSOLUTE) 0.4 0.0 - 0.4 x10E3/uL   Basophils Absolute 0.1 0.0 - 0.2 x10E3/uL   Immature Granulocytes 0 Not Estab. %   Immature Grans (Abs) 0.0 0.0 - 0.1 x10E3/uL  CMP14+EGFR   Collection Time: 12/15/22 10:19 AM  Result Value Ref Range   Glucose 97 70 - 99 mg/dL   BUN 17 8 - 27 mg/dL   Creatinine, Ser 9.22 0.57 - 1.00 mg/dL   eGFR 86 >40 fO/fpw/8.26   BUN/Creatinine Ratio 22 12 - 28   Sodium 141 134 - 144 mmol/L   Potassium 4.9 3.5 - 5.2 mmol/L   Chloride 103 96 - 106 mmol/L   CO2 26 20 - 29 mmol/L   Calcium  10.3 8.7 - 10.3 mg/dL   Total Protein 6.7 6.0 - 8.5 g/dL   Albumin 4.4 3.9 - 4.9 g/dL   Globulin, Total 2.3 1.5 - 4.5 g/dL   Albumin/Globulin Ratio 1.9 1.2 - 2.2   Bilirubin Total 0.7 0.0 - 1.2 mg/dL   Alkaline Phosphatase 109 44 - 121 IU/L   AST 17 0 - 40 IU/L   ALT 11 0 - 32 IU/L  Lipid panel   Collection Time: 12/15/22 10:19 AM  Result Value Ref Range   Cholesterol, Total 233 (H) 100 - 199 mg/dL   Triglycerides 708 (H) 0 - 149 mg/dL   HDL 58 >60 mg/dL   VLDL Cholesterol Cal 51 (H) 5 - 40 mg/dL   LDL Chol Calc (NIH) 875 (H) 0 - 99 mg/dL   Chol/HDL Ratio 4.0 0.0 - 4.4 ratio      Assessment & Plan:   Problem List Items Addressed This Visit       Cardiovascular and Mediastinum   Essential hypertension, benign   Chronic.  Controlled.  Continue with current medication regimen.  Will check labs at next visit.  Return to clinic in 1 month for reevaluation.  Call sooner if concerns arise.          Other   Depressive disorder   Chronic.  Well controlled.  Patient would like to wean off her medication.  Will start with Duloxetine  and wean from 60mg  to 30mg  daily.  Continue with Wellbutrin.  Follow up  in 1 month.  Call sooner if concerns arise.       Relevant Medications   DULoxetine  (CYMBALTA ) 30 MG capsule   Mixed hyperlipidemia   Chronic.  ASCVD risk score at 6%.  Patient is working on diet and exercise.  Continue with Rosuvastatin  20mg  daily.  Will check labs at next visit.       Other Visit Diagnoses       Encounter to establish care    -  Primary        Follow up plan: Return in about 1 month (around 04/08/2024).

## 2024-03-08 NOTE — Assessment & Plan Note (Signed)
 Chronic.  Well controlled.  Patient would like to wean off her medication.  Will start with Duloxetine  and wean from 60mg  to 30mg  daily.  Continue with Wellbutrin.  Follow up in 1 month.  Call sooner if concerns arise.

## 2024-03-26 DIAGNOSIS — Z6830 Body mass index (BMI) 30.0-30.9, adult: Secondary | ICD-10-CM | POA: Diagnosis not present

## 2024-03-26 DIAGNOSIS — I10 Essential (primary) hypertension: Secondary | ICD-10-CM | POA: Diagnosis not present

## 2024-03-26 DIAGNOSIS — F17211 Nicotine dependence, cigarettes, in remission: Secondary | ICD-10-CM | POA: Diagnosis not present

## 2024-03-26 DIAGNOSIS — Z008 Encounter for other general examination: Secondary | ICD-10-CM | POA: Diagnosis not present

## 2024-03-26 DIAGNOSIS — E785 Hyperlipidemia, unspecified: Secondary | ICD-10-CM | POA: Diagnosis not present

## 2024-03-26 DIAGNOSIS — E669 Obesity, unspecified: Secondary | ICD-10-CM | POA: Diagnosis not present

## 2024-03-27 ENCOUNTER — Other Ambulatory Visit: Payer: Self-pay | Admitting: Nurse Practitioner

## 2024-03-27 NOTE — Telephone Encounter (Unsigned)
 Copied from CRM [id number]. Topic: Clinical - Medication Refill >> Mar 27, 2024 12:52 PM Avram G wrote: Medication: gabapentin  (NEURONTIN ) 300 MG capsule [[id number]]  Has the patient contacted their pharmacy? Yes (Agent: If no, request that the patient contact the pharmacy for the refill. If patient does not wish to contact the pharmacy document the reason why and proceed with request.) (Agent: If yes, when and what did the pharmacy advise?)  This is the patient's preferred pharmacy:  [facility] - [place], [place] - [address] [place] [address] Phone: [phone] Fax: [phone]  Is this the correct pharmacy for this prescription? Yes If no, delete pharmacy and type the correct one.   Has the prescription been filled recently? No  Is the patient out of the medication? Yes  Has the patient been seen for an appointment in the last year OR does the patient have an upcoming appointment? Yes  Can we respond through MyChart? Yes  Agent: Please be advised that Rx refills may take up to 3 business days. We ask that you follow-up with your pharmacy.

## 2024-03-28 MED ORDER — GABAPENTIN 300 MG PO CAPS: 300.0000 mg | ORAL_CAPSULE | Freq: Every day | ORAL | 1 refills | Status: DC

## 2024-03-28 NOTE — Telephone Encounter (Signed)
 Requested medication (s) are due for refill today: yes  Requested medication (s) are on the active medication list: yes  Last refill:  04/14/23  Future visit scheduled: yes  Notes to clinic:  Unable to refill per protocol, last refill by another provider. Routing to PCP for approval.     Requested Prescriptions  Pending Prescriptions Disp Refills   gabapentin  (NEURONTIN ) 300 MG capsule 90 capsule 2    Sig: Take 1 capsule (300 mg total) by mouth at bedtime.     Neurology: Anticonvulsants - gabapentin  Failed - 03/28/2024  3:32 PM      Failed - Cr in normal range and within 360 days    Creatinine, Ser  Date Value Ref Range Status  12/15/2022 0.77 0.57 - 1.00 mg/dL Final         Passed - Completed PHQ-2 or PHQ-9 in the last 360 days      Passed - Valid encounter within last 12 months    Recent Outpatient Visits           2 weeks ago Encounter to establish care   [facility] Melvin Pao, NP

## 2024-04-10 ENCOUNTER — Ambulatory Visit (INDEPENDENT_AMBULATORY_CARE_PROVIDER_SITE_OTHER): Admitting: Nurse Practitioner

## 2024-04-10 ENCOUNTER — Encounter: Payer: Self-pay | Admitting: Nurse Practitioner

## 2024-04-10 ENCOUNTER — Ambulatory Visit
Admission: RE | Admit: 2024-04-10 | Discharge: 2024-04-10 | Disposition: A | Discharge status: Home / Self Care | Source: Ambulatory Visit | Attending: Nurse Practitioner | Admitting: Nurse Practitioner

## 2024-04-10 VITALS — BP 130/83 | HR 66 | Ht 68.0 in | Wt 217.2 lb

## 2024-04-10 DIAGNOSIS — I10 Essential (primary) hypertension: Secondary | ICD-10-CM

## 2024-04-10 DIAGNOSIS — E782 Mixed hyperlipidemia: Secondary | ICD-10-CM | POA: Diagnosis not present

## 2024-04-10 DIAGNOSIS — M79672 Pain in left foot: Secondary | ICD-10-CM

## 2024-04-10 DIAGNOSIS — Z114 Encounter for screening for human immunodeficiency virus [HIV]: Secondary | ICD-10-CM

## 2024-04-10 DIAGNOSIS — Z0000 Encounter for general adult medical examination without abnormal findings: Secondary | ICD-10-CM

## 2024-04-10 DIAGNOSIS — Z1159 Encounter for screening for other viral diseases: Secondary | ICD-10-CM | POA: Diagnosis not present

## 2024-04-10 DIAGNOSIS — F32A Depression, unspecified: Secondary | ICD-10-CM

## 2024-04-10 DIAGNOSIS — G8929 Other chronic pain: Secondary | ICD-10-CM | POA: Diagnosis not present

## 2024-04-10 DIAGNOSIS — M545 Low back pain, unspecified: Secondary | ICD-10-CM

## 2024-04-10 DIAGNOSIS — Z7189 Other specified counseling: Secondary | ICD-10-CM | POA: Diagnosis not present

## 2024-04-10 NOTE — Assessment & Plan Note (Signed)
 A voluntary discussion about advance care planning including the explanation and discussion of advance directives was extensively discussed  with the patient for 3 minutes with patient.  Explanation about the health care proxy and Living will was reviewed and packet with forms with explanation of how to fill them out was given.  During this discussion, the patient was able to identify a health care proxy as her daughter and does not wish to make any changes to her advanced care plan at this time.

## 2024-04-10 NOTE — Assessment & Plan Note (Signed)
Chronic.  Controlled.  Continue with current medication regimen.  Will check labs at next visit. Return to clinic in 3 months for reevaluation.  Call sooner if concerns arise.

## 2024-04-10 NOTE — Assessment & Plan Note (Signed)
 Labs ordered at visit today.  Will make recommendations based on lab results.

## 2024-04-10 NOTE — Assessment & Plan Note (Signed)
 Chronic. Not well controlled.  Was taking Meloxicam but stopped due to side effects.  Has been on pain medication in the past but not currently taking anything.  Gabapentin  or Duloxetine  have not helped with pain. Referral placed for patient to see pain management.

## 2024-04-10 NOTE — Progress Notes (Signed)
 BP 130/83 (BP Location: Right Arm, Patient Position: Sitting, Cuff Size: Large)   Pulse 66   Ht 5' 8 (1.727 m)   Wt 217 lb 3.2 oz (98.5 kg)   SpO2 93%   BMI 33.03 kg/m    Subjective:    Patient ID: Madeline Zimmerman, female    DOB: April 18, 1961, 63 y.o.   MRN: [id number]  HPI: Madeline Zimmerman is a 63 y.o. female presenting on 04/10/2024 for comprehensive medical examination. Current medical complaints include:back pain  She currently lives with: Menopausal Symptoms: no  BACK PAIN Patient has been to the chiropractor for several years and had injections in her back.  She has done pain opioids, meloxicam and yoga.  She is taking Gabapentin  but it isn't helping with her pain.   Duration: year Mechanism of injury: unknown Location: L>R and low back Onset: gradual Severity: 6/10 Quality: sharp, aching, and burning Frequency: intermittent Radiation: buttocks, R leg above the knee, and L leg above the knee Aggravating factors: lifting Alleviating factors: ice, heat, and NSAIDs Status: stable Treatments attempted: rest, ice, heat, and ibuprofen  Relief with NSAIDs?: mild Nighttime pain:  no Paresthesias / decreased sensation:  no Bowel / bladder incontinence:  no Fevers:  no Dysuria / urinary frequency:  no  Patient states she has been having left foot pain and swelling.  At times it is hard to put her shoe on or walk.    MOOD Patient states she is taking the Duloxetine  and Wellbutrin.  Last visit her dose we decreased her dose from Duloxetine  from 60mg  to 30mg  daily.  She didn't have any significant side effects and feels like she is doing well without it.   Depression Screen done today and results listed below:     04/10/2024   12:02 PM 03/08/2024   10:48 AM 03/08/2024   10:27 AM 09/29/2017    9:53 AM 07/26/2017   11:42 AM  Depression screen PHQ 2/9  Decreased Interest 0 0 0 0 0  Down, Depressed, Hopeless 0 0 0 0 0  PHQ - 2 Score 0 0 0 0 0  Altered sleeping 1 0 0    Tired,  decreased energy 1 0 0    Change in appetite 0 0 0    Feeling bad or failure about yourself  0 0 0    Trouble concentrating 0 0 0    Moving slowly or fidgety/restless 0 0 0    Suicidal thoughts 0 0 0    PHQ-9 Score 2 0 0    Difficult doing work/chores Not difficult at all Not difficult at all Not difficult at all      The patient does not have a history of falls. I did complete a risk assessment for falls. A plan of care for falls was documented.   Past Medical History:  Past Medical History:  Diagnosis Date   Anxiety and depression    Chronic pain    With narcotic dependence   DDD (degenerative disc disease), cervical    Graves disease    HTN (hypertension)    Hyperlipidemia    Polycythemia     Surgical History:  Past Surgical History:  Procedure Laterality Date   REDUCTION MAMMAPLASTY  2012   revision breast reduction  2013    Medications:  Current Outpatient Medications on File Prior to Visit  Medication Sig   bisoprolol-hydrochlorothiazide (ZIAC) 5-6.25 MG tablet TAKE 1 TABLET BY MOUTH ONCE DAILY   buPROPion (WELLBUTRIN XL) 150 MG  24 hr tablet TAKE 1 TABLET BY MOUTH ONCE DAILY   DULoxetine  (CYMBALTA ) 30 MG capsule Take 1 capsule (30 mg total) by mouth daily.   fluticasone (FLONASE) 50 MCG/ACT nasal spray Place into the nose.   gabapentin  (NEURONTIN ) 300 MG capsule Take 1 capsule (300 mg total) by mouth at bedtime.   omeprazole (PRILOSEC) 40 MG capsule TAKE 1 CAPSULE BY MOUTH ONCE DAILY   rosuvastatin  (CRESTOR ) 20 MG tablet TAKE 1 TABLET BY MOUTH ONCE EVERY EVENING   No current facility-administered medications on file prior to visit.    Allergies:  Allergies  Allergen Reactions   Bacitracin Itching and Rash   Neosporin  [Neomycin-Bacitracin Zn-Polymyx] Rash    Social History:  Social History   Socioeconomic History   Marital status: Widowed    Spouse name: Not on file   Number of children: Not on file   Years of education: Not on file   Highest  education level: Not on file  Occupational History   Not on file  Tobacco Use   Smoking status: Former   Smokeless tobacco: Never  Vaping Use   Vaping status: Never Used  Substance and Sexual Activity   Alcohol use: No    Alcohol/week: 0.0 standard drinks of alcohol   Drug use: No   Sexual activity: Not Currently  Other Topics Concern   Not on file  Social History Narrative   Not on file   Social Drivers of Health   Financial Resource Strain: Low Risk  (03/08/2024)   Overall Financial Resource Strain (CARDIA)    Difficulty of Paying Living Expenses: Not hard at all  Food Insecurity: No Food Insecurity (03/08/2024)   Hunger Vital Sign    Worried About Running Out of Food in the Last Year: Never true    Ran Out of Food in the Last Year: Never true  Transportation Needs: No Transportation Needs (03/08/2024)   PRAPARE - Administrator, Civil Service (Medical): No    Lack of Transportation (Non-Medical): No  Physical Activity: Inactive (03/08/2024)   Exercise Vital Sign    Days of Exercise per Week: 0 days    Minutes of Exercise per Session: 0 min  Stress: No Stress Concern Present (03/08/2024)   [employer] of Occupational Health - Occupational Stress Questionnaire    Feeling of Stress: Not at all  Social Connections: Moderately Isolated (03/08/2024)   Social Connection and Isolation Panel    Frequency of Communication with Friends and Family: More than three times a week    Frequency of Social Gatherings with Friends and Family: Twice a week    Attends Religious Services: More than 4 times per year    Active Member of [employer] or Organizations: No    Attends Banker Meetings: Never    Marital Status: Widowed  Intimate Partner Violence: Not At Risk (03/08/2024)   Humiliation, Afraid, Rape, and Kick questionnaire    Fear of Current or Ex-Partner: No    Emotionally Abused: No    Physically Abused: No    Sexually Abused: No   Social History    Tobacco Use  Smoking Status Former  Smokeless Tobacco Never   Social History   Substance and Sexual Activity  Alcohol Use No   Alcohol/week: 0.0 standard drinks of alcohol    Family History:  Family History  Problem Relation Age of Onset   Hypertension Mother    Heart failure Father    Cancer Brother    Asthma Brother  Diabetes Brother     Past medical history, surgical history, medications, allergies, family history and social history reviewed with patient today and changes made to appropriate areas of the chart.   Review of Systems  Musculoskeletal:  Positive for back pain.       Left foot pain and swelling  Psychiatric/Behavioral:  Positive for depression.    All other ROS negative except what is listed above and in the HPI.      Objective:    BP 130/83 (BP Location: Right Arm, Patient Position: Sitting, Cuff Size: Large)   Pulse 66   Ht 5' 8 (1.727 m)   Wt 217 lb 3.2 oz (98.5 kg)   SpO2 93%   BMI 33.03 kg/m   Wt Readings from Last 3 Encounters:  04/10/24 217 lb 3.2 oz (98.5 kg)  03/08/24 216 lb 3.2 oz (98.1 kg)  12/16/22 205 lb (93 kg)    Physical Exam Vitals and nursing note reviewed.  Constitutional:      General: She is awake. She is not in acute distress.    Appearance: Normal appearance. She is well-developed. She is not ill-appearing.  HENT:     Head: Normocephalic and atraumatic.     Right Ear: Hearing, tympanic membrane, ear canal and external ear normal. No drainage.     Left Ear: Hearing, tympanic membrane, ear canal and external ear normal. No drainage.     Nose: Nose normal.     Right Sinus: No maxillary sinus tenderness or frontal sinus tenderness.     Left Sinus: No maxillary sinus tenderness or frontal sinus tenderness.     Mouth/Throat:     Mouth: Mucous membranes are moist.     Pharynx: Oropharynx is clear. Uvula midline. No pharyngeal swelling, oropharyngeal exudate or posterior oropharyngeal erythema.  Eyes:     General: Lids  are normal.        Right eye: No discharge.        Left eye: No discharge.     Extraocular Movements: Extraocular movements intact.     Conjunctiva/sclera: Conjunctivae normal.     Pupils: Pupils are equal, round, and reactive to light.     Visual Fields: Right eye visual fields normal and left eye visual fields normal.  Neck:     Thyroid: No thyromegaly.     Vascular: No carotid bruit.     Trachea: Trachea normal.  Cardiovascular:     Rate and Rhythm: Normal rate and regular rhythm.     Heart sounds: Normal heart sounds. No murmur heard.    No gallop.  Pulmonary:     Effort: Pulmonary effort is normal. No accessory muscle usage or respiratory distress.     Breath sounds: Normal breath sounds.  Chest:  Breasts:    Right: Normal.     Left: Normal.  Abdominal:     General: Bowel sounds are normal.     Palpations: Abdomen is soft. There is no hepatomegaly or splenomegaly.     Tenderness: There is no abdominal tenderness.  Musculoskeletal:        General: Normal range of motion.     Cervical back: Normal range of motion and neck supple.     Right lower leg: No edema.     Left lower leg: No edema.       Feet:  Lymphadenopathy:     Head:     Right side of head: No submental, submandibular, tonsillar, preauricular or posterior auricular adenopathy.     Left  side of head: No submental, submandibular, tonsillar, preauricular or posterior auricular adenopathy.     Cervical: No cervical adenopathy.     Upper Body:     Right upper body: No supraclavicular, axillary or pectoral adenopathy.     Left upper body: No supraclavicular, axillary or pectoral adenopathy.  Skin:    General: Skin is warm and dry.     Capillary Refill: Capillary refill takes less than 2 seconds.     Findings: No rash.  Neurological:     Mental Status: She is alert and oriented to person, place, and time.     Gait: Gait is intact.  Psychiatric:        Attention and Perception: Attention normal.        Mood  and Affect: Mood normal.        Speech: Speech normal.        Behavior: Behavior normal. Behavior is cooperative.        Thought Content: Thought content normal.        Judgment: Judgment normal.     Results for orders placed or performed in visit on 12/15/22  CBC With Differential   Collection Time: 12/15/22 10:19 AM  Result Value Ref Range   WBC 9.1 3.4 - 10.8 x10E3/uL   RBC 5.15 3.77 - 5.28 x10E6/uL   Hemoglobin 15.4 11.1 - 15.9 g/dL   Hematocrit 53.0 (H) 65.9 - 46.6 %   MCV 91 79 - 97 fL   MCH 29.9 26.6 - 33.0 pg   MCHC 32.8 31.5 - 35.7 g/dL   RDW 87.1 88.2 - 84.5 %   Neutrophils 60 Not Estab. %   Lymphs 27 Not Estab. %   Monocytes 7 Not Estab. %   Eos 5 Not Estab. %   Basos 1 Not Estab. %   Neutrophils Absolute 5.4 1.4 - 7.0 x10E3/uL   Lymphocytes Absolute 2.5 0.7 - 3.1 x10E3/uL   Monocytes Absolute 0.7 0.1 - 0.9 x10E3/uL   EOS (ABSOLUTE) 0.4 0.0 - 0.4 x10E3/uL   Basophils Absolute 0.1 0.0 - 0.2 x10E3/uL   Immature Granulocytes 0 Not Estab. %   Immature Grans (Abs) 0.0 0.0 - 0.1 x10E3/uL  CMP14+EGFR   Collection Time: 12/15/22 10:19 AM  Result Value Ref Range   Glucose 97 70 - 99 mg/dL   BUN 17 8 - 27 mg/dL   Creatinine, Ser 9.22 0.57 - 1.00 mg/dL   eGFR 86 >40 fO/fpw/8.26   BUN/Creatinine Ratio 22 12 - 28   Sodium 141 134 - 144 mmol/L   Potassium 4.9 3.5 - 5.2 mmol/L   Chloride 103 96 - 106 mmol/L   CO2 26 20 - 29 mmol/L   Calcium  10.3 8.7 - 10.3 mg/dL   Total Protein 6.7 6.0 - 8.5 g/dL   Albumin 4.4 3.9 - 4.9 g/dL   Globulin, Total 2.3 1.5 - 4.5 g/dL   Albumin/Globulin Ratio 1.9 1.2 - 2.2   Bilirubin Total 0.7 0.0 - 1.2 mg/dL   Alkaline Phosphatase 109 44 - 121 IU/L   AST 17 0 - 40 IU/L   ALT 11 0 - 32 IU/L  Lipid panel   Collection Time: 12/15/22 10:19 AM  Result Value Ref Range   Cholesterol, Total 233 (H) 100 - 199 mg/dL   Triglycerides 708 (H) 0 - 149 mg/dL   HDL 58 >60 mg/dL   VLDL Cholesterol Cal 51 (H) 5 - 40 mg/dL   LDL Chol Calc (NIH) 875  (H) 0 - 99 mg/dL  Chol/HDL Ratio 4.0 0.0 - 4.4 ratio      Assessment & Plan:   Problem List Items Addressed This Visit       Cardiovascular and Mediastinum   Essential hypertension, benign   Chronic.  Controlled.  Continue with current medication regimen.  Will check labs at next visit.  Return to clinic in 3 months for reevaluation.  Call sooner if concerns arise.         Other   Depressive disorder   Chronic.  Controlled.  No side effects or worsening depression since decreasing Duloxetine  dose.  Patient would like to continue to wean down medication.  Recommend taking Duloxetine  30mg  every other day for 1 week then stopping medication.  Continue with Wellbutrin.  Will work to wean down medication Wellbutrin next.       Chronic midline low back pain without sciatica   Chronic. Not well controlled.  Was taking Meloxicam but stopped due to side effects.  Has been on pain medication in the past but not currently taking anything.  Gabapentin  or Duloxetine  have not helped with pain. Referral placed for patient to see pain management.       Relevant Orders   Ambulatory referral to Pain Clinic   Mixed hyperlipidemia   Labs ordered at visit today.  Will make recommendations based on lab results.        Relevant Orders   Lipid panel   Advanced care planning/counseling discussion   A voluntary discussion about advance care planning including the explanation and discussion of advance directives was extensively discussed  with the patient for 3 minutes with patient.  Explanation about the health care proxy and Living will was reviewed and packet with forms with explanation of how to fill them out was given.  During this discussion, the patient was able to identify a health care proxy as her daughter and does not wish to make any changes to her advanced care plan at this time.       Other Visit Diagnoses       Welcome to Medicare preventive visit    -  Primary   EKG showed normal sinus  rhythm. Has an advanced care plan.   Relevant Orders   EKG 12-Lead     Annual physical exam       Health maintenance reviewed during visit today.  Labs ordered.  Vaccines reviewed.   Relevant Orders   CBC with Differential/Platelet   Comprehensive metabolic panel with GFR   Lipid panel   TSH     Left foot pain       Xray ordered for evaluation of left foot pain and swelling.   Relevant Orders   DG Foot Complete Left     Screening for HIV (human immunodeficiency virus)       Relevant Orders   HIV Antibody (routine testing w rflx)     Encounter for hepatitis C screening test for low risk patient       Relevant Orders   Hepatitis C antibody        Follow up plan: Return in about 1 month (around 05/10/2024) for Depression/Anxiety FU.   LABORATORY TESTING:  - Pap smear: not applicable  IMMUNIZATIONS:   - Tdap: Tetanus vaccination status reviewed: refused. - Influenza: Refused - Pneumovax: Refused - Prevnar: Refused - COVID: Refused - HPV: Not applicable - Shingrix vaccine: Refused  SCREENING: -Mammogram: Refused  - Colonoscopy: Refused  - Bone Density: Refused  -Hearing Test: Not applicable  -Spirometry: Not  applicable   PATIENT COUNSELING:   Advised to take 1 mg of folate supplement per day if capable of pregnancy.   Sexuality: Discussed sexually transmitted diseases, partner selection, use of condoms, avoidance of unintended pregnancy  and contraceptive alternatives.   Advised to avoid cigarette smoking.  I discussed with the patient that most people either abstain from alcohol or drink within safe limits (<=14/week and <=4 drinks/occasion for males, <=7/weeks and <= 3 drinks/occasion for females) and that the risk for alcohol disorders and other health effects rises proportionally with the number of drinks per week and how often a drinker exceeds daily limits.  Discussed cessation/primary prevention of drug use and availability of treatment for abuse.   Diet:  Encouraged to adjust caloric intake to maintain  or achieve ideal body weight, to reduce intake of dietary saturated fat and total fat, to limit sodium intake by avoiding high sodium foods and not adding table salt, and to maintain adequate dietary potassium and calcium  preferably from fresh fruits, vegetables, and low-fat dairy products.    stressed the importance of regular exercise  Injury prevention: Discussed safety belts, safety helmets, smoke detector, smoking near bedding or upholstery.   Dental health: Discussed importance of regular tooth brushing, flossing, and dental visits.    NEXT PREVENTATIVE PHYSICAL DUE IN 1 YEAR. Return in about 1 month (around 05/10/2024) for Depression/Anxiety FU.

## 2024-04-10 NOTE — Assessment & Plan Note (Signed)
 Chronic.  Controlled.  No side effects or worsening depression since decreasing Duloxetine  dose.  Patient would like to continue to wean down medication.  Recommend taking Duloxetine  30mg  every other day for 1 week then stopping medication.  Continue with Wellbutrin.  Will work to wean down medication Wellbutrin next.

## 2024-04-11 ENCOUNTER — Ambulatory Visit: Payer: Self-pay | Admitting: Nurse Practitioner

## 2024-04-11 DIAGNOSIS — M79672 Pain in left foot: Secondary | ICD-10-CM

## 2024-04-11 LAB — COMPREHENSIVE METABOLIC PANEL WITH GFR
ALT: 12 IU/L (ref 0–32)
AST: 17 IU/L (ref 0–40)
Albumin: 4 g/dL (ref 3.9–4.9)
Alkaline Phosphatase: 108 IU/L (ref 49–135)
BUN/Creatinine Ratio: 16 (ref 12–28)
BUN: 14 mg/dL (ref 8–27)
Bilirubin Total: 0.4 mg/dL (ref 0.0–1.2)
CO2: 27 mmol/L (ref 20–29)
Calcium: 10 mg/dL (ref 8.7–10.3)
Chloride: 99 mmol/L (ref 96–106)
Creatinine, Ser: 0.85 mg/dL (ref 0.57–1.00)
Globulin, Total: 2.6 g/dL (ref 1.5–4.5)
Glucose: 85 mg/dL (ref 70–99)
Potassium: 4.7 mmol/L (ref 3.5–5.2)
Sodium: 140 mmol/L (ref 134–144)
Total Protein: 6.6 g/dL (ref 6.0–8.5)
eGFR: 76 mL/min/1.73 (ref >59)

## 2024-04-11 LAB — HEPATITIS C ANTIBODY: Hep C Virus Ab: NONREACTIVE

## 2024-04-11 LAB — CBC WITH DIFFERENTIAL/PLATELET
Basophils Absolute: 0.1 x10E3/uL (ref 0.0–0.2)
Basos: 1 %
EOS (ABSOLUTE): 0.4 x10E3/uL (ref 0.0–0.4)
Eos: 5 %
Hematocrit: 47.4 % — ABNORMAL HIGH (ref 34.0–46.6)
Hemoglobin: 15.5 g/dL (ref 11.1–15.9)
Immature Grans (Abs): 0 x10E3/uL (ref 0.0–0.1)
Immature Granulocytes: 0 %
Lymphocytes Absolute: 2.5 x10E3/uL (ref 0.7–3.1)
Lymphs: 35 %
MCH: 30.3 pg (ref 26.6–33.0)
MCHC: 32.7 g/dL (ref 31.5–35.7)
MCV: 93 fL (ref 79–97)
Monocytes Absolute: 0.7 x10E3/uL (ref 0.1–0.9)
Monocytes: 9 %
Neutrophils Absolute: 3.6 x10E3/uL (ref 1.4–7.0)
Neutrophils: 50 %
Platelets: 352 x10E3/uL (ref 150–450)
RBC: 5.12 x10E6/uL (ref 3.77–5.28)
RDW: 12.4 % (ref 11.7–15.4)
WBC: 7.2 x10E3/uL (ref 3.4–10.8)

## 2024-04-11 LAB — LIPID PANEL
Chol/HDL Ratio: 3.7 ratio (ref 0.0–4.4)
Cholesterol, Total: 197 mg/dL (ref 100–199)
HDL: 53 mg/dL (ref >39)
LDL Chol Calc (NIH): 109 mg/dL — ABNORMAL HIGH (ref 0–99)
Triglycerides: 201 mg/dL — ABNORMAL HIGH (ref 0–149)
VLDL Cholesterol Cal: 35 mg/dL (ref 5–40)

## 2024-04-11 LAB — TSH: TSH: 3.52 u[IU]/mL (ref 0.450–4.500)

## 2024-04-11 LAB — HIV ANTIBODY (ROUTINE TESTING W REFLEX): HIV Screen 4th Generation wRfx: NONREACTIVE

## 2024-04-12 ENCOUNTER — Other Ambulatory Visit: Payer: Self-pay | Admitting: Nurse Practitioner

## 2024-04-12 DIAGNOSIS — Z1231 Encounter for screening mammogram for malignant neoplasm of breast: Secondary | ICD-10-CM

## 2024-04-18 ENCOUNTER — Encounter: Payer: Self-pay | Admitting: Nurse Practitioner

## 2024-04-27 DIAGNOSIS — M5416 Radiculopathy, lumbar region: Secondary | ICD-10-CM | POA: Diagnosis not present

## 2024-04-30 ENCOUNTER — Other Ambulatory Visit: Payer: Self-pay | Admitting: Nurse Practitioner

## 2024-04-30 ENCOUNTER — Other Ambulatory Visit: Payer: Self-pay | Admitting: Internal Medicine

## 2024-04-30 DIAGNOSIS — K2100 Gastro-esophageal reflux disease with esophagitis, without bleeding: Secondary | ICD-10-CM

## 2024-04-30 NOTE — Telephone Encounter (Signed)
 omeprazole (PRILOSEC) 40 MG capsule 40 mg, Daily

## 2024-05-01 MED ORDER — OMEPRAZOLE 40 MG PO CPDR: 40.0000 mg | DELAYED_RELEASE_CAPSULE | Freq: Every day | ORAL | 1 refills | Status: AC

## 2024-05-01 NOTE — Telephone Encounter (Signed)
 Refill needed before upcoming appointment.

## 2024-05-08 ENCOUNTER — Encounter: Payer: Self-pay | Admitting: Nurse Practitioner

## 2024-05-08 ENCOUNTER — Telehealth: Admitting: Nurse Practitioner

## 2024-05-08 ENCOUNTER — Ambulatory Visit: Admitting: Nurse Practitioner

## 2024-05-08 DIAGNOSIS — F32A Depression, unspecified: Secondary | ICD-10-CM | POA: Diagnosis not present

## 2024-05-08 NOTE — Assessment & Plan Note (Signed)
 Chronic.  Well controlled.  Doing well with stopping the Duloxetine .  Would like to continue to wean down Wellbutrin.  Recommend taking 1 tab of Wellbutrin every other day for one week then stop the medication.  Discussed side effects and benefits of medication.  Follow up in 1 month after stopping Wellbutrin.

## 2024-05-08 NOTE — Progress Notes (Signed)
 There were no vitals taken for this visit.   Subjective:    Patient ID: Madeline Zimmerman, female    DOB: [birth year], 63 y.o.   MRN: [id number]  HPI: Madeline Zimmerman is a 63 y.o. female  Chief Complaint  Patient presents with   Anxiety   Depression   MOOD Patient states she is taking the Duloxetine  and Wellbutrin.  Since last visit patient was weaned off the Duloxetine  and she feels like she is doing well.  She has been off the Duloxetine  for about 3 weeks.  She is still taking the Wellbutrin 150mg  but ready to wean off the medication.  Does state that she has been having hot flashes and buzzing bees around her head.    Relevant past medical, surgical, family and social history reviewed and updated as indicated. Interim medical history since our last visit reviewed. Allergies and medications reviewed and updated.  Review of Systems  Endocrine: Positive for heat intolerance.  Neurological:        Buzzing bees  Psychiatric/Behavioral:  Positive for dysphoric mood. The patient is nervous/anxious.     Per HPI unless specifically indicated above     Objective:    There were no vitals taken for this visit.  Wt Readings from Last 3 Encounters:  04/10/24 217 lb 3.2 oz (98.5 kg)  03/08/24 216 lb 3.2 oz (98.1 kg)  12/16/22 205 lb (93 kg)    Physical Exam Vitals and nursing note reviewed.  HENT:     Head: Normocephalic.     Right Ear: Hearing normal.     Left Ear: Hearing normal.     Nose: Nose normal.  Eyes:     Pupils: Pupils are equal, round, and reactive to light.  Pulmonary:     Effort: Pulmonary effort is normal. No respiratory distress.  Neurological:     Mental Status: She is alert.  Psychiatric:        Mood and Affect: Mood normal.        Behavior: Behavior normal.        Thought Content: Thought content normal.        Judgment: Judgment normal.     Results for orders placed or performed in visit on 04/10/24  CBC with Differential/Platelet   Collection Time: 04/10/24  12:14 PM  Result Value Ref Range   WBC 7.2 3.4 - 10.8 x10E3/uL   RBC 5.12 3.77 - 5.28 x10E6/uL   Hemoglobin 15.5 11.1 - 15.9 g/dL   Hematocrit 52.5 (H) 65.9 - 46.6 %   MCV 93 79 - 97 fL   MCH 30.3 26.6 - 33.0 pg   MCHC 32.7 31.5 - 35.7 g/dL   RDW 87.5 88.2 - 84.5 %   Platelets 352 150 - 450 x10E3/uL   Neutrophils 50 Not Estab. %   Lymphs 35 Not Estab. %   Monocytes 9 Not Estab. %   Eos 5 Not Estab. %   Basos 1 Not Estab. %   Neutrophils Absolute 3.6 1.4 - 7.0 x10E3/uL   Lymphocytes Absolute 2.5 0.7 - 3.1 x10E3/uL   Monocytes Absolute 0.7 0.1 - 0.9 x10E3/uL   EOS (ABSOLUTE) 0.4 0.0 - 0.4 x10E3/uL   Basophils Absolute 0.1 0.0 - 0.2 x10E3/uL   Immature Granulocytes 0 Not Estab. %   Immature Grans (Abs) 0.0 0.0 - 0.1 x10E3/uL  Comprehensive metabolic panel with GFR   Collection Time: 04/10/24 12:14 PM  Result Value Ref Range   Glucose 85 70 - 99 mg/dL  BUN 14 8 - 27 mg/dL   Creatinine, Ser 9.14 0.57 - 1.00 mg/dL   eGFR 76 >40 fO/fpw/8.26   BUN/Creatinine Ratio 16 12 - 28   Sodium 140 134 - 144 mmol/L   Potassium 4.7 3.5 - 5.2 mmol/L   Chloride 99 96 - 106 mmol/L   CO2 27 20 - 29 mmol/L   Calcium  10.0 8.7 - 10.3 mg/dL   Total Protein 6.6 6.0 - 8.5 g/dL   Albumin 4.0 3.9 - 4.9 g/dL   Globulin, Total 2.6 1.5 - 4.5 g/dL   Bilirubin Total 0.4 0.0 - 1.2 mg/dL   Alkaline Phosphatase 108 49 - 135 IU/L   AST 17 0 - 40 IU/L   ALT 12 0 - 32 IU/L  Lipid panel   Collection Time: 04/10/24 12:14 PM  Result Value Ref Range   Cholesterol, Total 197 100 - 199 mg/dL   Triglycerides 798 (H) 0 - 149 mg/dL   HDL 53 >60 mg/dL   VLDL Cholesterol Cal 35 5 - 40 mg/dL   LDL Chol Calc (NIH) 890 (H) 0 - 99 mg/dL   Chol/HDL Ratio 3.7 0.0 - 4.4 ratio  TSH   Collection Time: 04/10/24 12:14 PM  Result Value Ref Range   TSH 3.520 0.450 - 4.500 uIU/mL  Hepatitis C antibody   Collection Time: 04/10/24 12:14 PM  Result Value Ref Range   Hep C Virus Ab Non Reactive Non Reactive  HIV Antibody  (routine testing w rflx)   Collection Time: 04/10/24 12:14 PM  Result Value Ref Range   HIV Screen 4th Generation wRfx Non Reactive Non Reactive      Assessment & Plan:   Problem List Items Addressed This Visit       Other   Depressive disorder - Primary   Chronic.  Well controlled.  Doing well with stopping the Duloxetine .  Would like to continue to wean down Wellbutrin.  Recommend taking 1 tab of Wellbutrin every other day for one week then stop the medication.  Discussed side effects and benefits of medication.  Follow up in 1 month after stopping Wellbutrin.        Follow up plan: Return in about 1 month (around 06/08/2024) for Depression/Anxiety FU (virtual).   This visit was completed via MyChart due to the restrictions of the COVID-19 pandemic. All issues as above were discussed and addressed. Physical exam was done as above through visual confirmation on MyChart. If it was felt that the patient should be evaluated in the office, they were directed there. The patient verbally consented to this visit. Location of the patient: Home Location of the provider: Office Those involved with this call:  Provider: Darice Petty, NP CMA: Laymon Metro, CMA Front Desk/Registration: Claretta Maiden This encounter was conducted via video.  I spent 30 minutes dedicated to the care of this patient on the date of this encounter to include previsit review of plan of care, medications, side effects and follow up, face to face time with the patient, and post visit ordering of testing.

## 2024-05-08 NOTE — Progress Notes (Signed)
 Scheduled

## 2024-05-08 NOTE — Progress Notes (Deleted)
 There were no vitals taken for this visit.   Subjective:    Patient ID: Madeline Zimmerman, female    DOB: 1960/09/10, 63 y.o.   MRN: [id number]  HPI: Madeline Zimmerman is a 62 y.o. female  No chief complaint on file.  MOOD Patient states she is taking the Duloxetine  and Wellbutrin.  Last visit her dose we decreased her dose from Duloxetine  from 60mg  to 30mg  daily.  She didn't have any significant side effects and feels like she is doing well without it.   Relevant past medical, surgical, family and social history reviewed and updated as indicated. Interim medical history since our last visit reviewed. Allergies and medications reviewed and updated.  Review of Systems  Per HPI unless specifically indicated above     Objective:    There were no vitals taken for this visit.  Wt Readings from Last 3 Encounters:  04/10/24 217 lb 3.2 oz (98.5 kg)  03/08/24 216 lb 3.2 oz (98.1 kg)  12/16/22 205 lb (93 kg)    Physical Exam  Results for orders placed or performed in visit on 04/10/24  CBC with Differential/Platelet   Collection Time: 04/10/24 12:14 PM  Result Value Ref Range   WBC 7.2 3.4 - 10.8 x10E3/uL   RBC 5.12 3.77 - 5.28 x10E6/uL   Hemoglobin 15.5 11.1 - 15.9 g/dL   Hematocrit 52.5 (H) 65.9 - 46.6 %   MCV 93 79 - 97 fL   MCH 30.3 26.6 - 33.0 pg   MCHC 32.7 31.5 - 35.7 g/dL   RDW 87.5 88.2 - 84.5 %   Platelets 352 150 - 450 x10E3/uL   Neutrophils 50 Not Estab. %   Lymphs 35 Not Estab. %   Monocytes 9 Not Estab. %   Eos 5 Not Estab. %   Basos 1 Not Estab. %   Neutrophils Absolute 3.6 1.4 - 7.0 x10E3/uL   Lymphocytes Absolute 2.5 0.7 - 3.1 x10E3/uL   Monocytes Absolute 0.7 0.1 - 0.9 x10E3/uL   EOS (ABSOLUTE) 0.4 0.0 - 0.4 x10E3/uL   Basophils Absolute 0.1 0.0 - 0.2 x10E3/uL   Immature Granulocytes 0 Not Estab. %   Immature Grans (Abs) 0.0 0.0 - 0.1 x10E3/uL  Comprehensive metabolic panel with GFR   Collection Time: 04/10/24 12:14 PM  Result Value Ref Range   Glucose 85 70 -  99 mg/dL   BUN 14 8 - 27 mg/dL   Creatinine, Ser 9.14 0.57 - 1.00 mg/dL   eGFR 76 >40 fO/fpw/8.26   BUN/Creatinine Ratio 16 12 - 28   Sodium 140 134 - 144 mmol/L   Potassium 4.7 3.5 - 5.2 mmol/L   Chloride 99 96 - 106 mmol/L   CO2 27 20 - 29 mmol/L   Calcium  10.0 8.7 - 10.3 mg/dL   Total Protein 6.6 6.0 - 8.5 g/dL   Albumin 4.0 3.9 - 4.9 g/dL   Globulin, Total 2.6 1.5 - 4.5 g/dL   Bilirubin Total 0.4 0.0 - 1.2 mg/dL   Alkaline Phosphatase 108 49 - 135 IU/L   AST 17 0 - 40 IU/L   ALT 12 0 - 32 IU/L  Lipid panel   Collection Time: 04/10/24 12:14 PM  Result Value Ref Range   Cholesterol, Total 197 100 - 199 mg/dL   Triglycerides 798 (H) 0 - 149 mg/dL   HDL 53 >60 mg/dL   VLDL Cholesterol Cal 35 5 - 40 mg/dL   LDL Chol Calc (NIH) 890 (H) 0 - 99 mg/dL  Chol/HDL Ratio 3.7 0.0 - 4.4 ratio  TSH   Collection Time: 04/10/24 12:14 PM  Result Value Ref Range   TSH 3.520 0.450 - 4.500 uIU/mL  Hepatitis C antibody   Collection Time: 04/10/24 12:14 PM  Result Value Ref Range   Hep C Virus Ab Non Reactive Non Reactive  HIV Antibody (routine testing w rflx)   Collection Time: 04/10/24 12:14 PM  Result Value Ref Range   HIV Screen 4th Generation wRfx Non Reactive Non Reactive      Assessment & Plan:   Problem List Items Addressed This Visit   None    Follow up plan: No follow-ups on file.

## 2024-05-15 ENCOUNTER — Inpatient Hospital Stay: Admission: RE | Admit: 2024-05-15 | Source: Ambulatory Visit

## 2024-05-18 ENCOUNTER — Other Ambulatory Visit: Payer: Self-pay | Admitting: Nurse Practitioner

## 2024-05-19 NOTE — Telephone Encounter (Signed)
 Too soon for refill, [facility] 03/28/24.  Requested Prescriptions  Pending Prescriptions Disp Refills   gabapentin  (NEURONTIN ) 300 MG capsule [Pharmacy Med Name: GABAPENTIN  300 MG CAP] 90 capsule 1    Sig: TAKE 1 CAPSULE BY MOUTH AT BEDTIME     Neurology: Anticonvulsants - gabapentin  Passed - 05/19/2024  9:17 AM      Passed - Cr in normal range and within 360 days    Creatinine, Ser  Date Value Ref Range Status  04/10/2024 0.85 0.57 - 1.00 mg/dL Final         Passed - Completed PHQ-2 or PHQ-9 in the last 360 days      Passed - Valid encounter within last 12 months    Recent Outpatient Visits           1 week ago Depressive disorder   [place] [facility] Melvin Pao, NP   1 month ago [facility] [employer] preventive visit   [place] [facility] Melvin Pao, NP   2 months ago Encounter to establish care   [place] [facility] Melvin Pao, NP

## 2024-06-04 ENCOUNTER — Ambulatory Visit: Admitting: Nurse Practitioner

## 2024-06-04 ENCOUNTER — Encounter: Payer: Self-pay | Admitting: Nurse Practitioner

## 2024-06-04 VITALS — BP 121/76 | HR 62 | Temp 98.6°F | Ht 68.0 in | Wt 218.6 lb

## 2024-06-04 DIAGNOSIS — F32A Depression, unspecified: Secondary | ICD-10-CM

## 2024-06-04 DIAGNOSIS — Z1211 Encounter for screening for malignant neoplasm of colon: Secondary | ICD-10-CM | POA: Diagnosis not present

## 2024-06-04 DIAGNOSIS — G8929 Other chronic pain: Secondary | ICD-10-CM | POA: Diagnosis not present

## 2024-06-04 DIAGNOSIS — M545 Low back pain, unspecified: Secondary | ICD-10-CM

## 2024-06-04 MED ORDER — GABAPENTIN 300 MG PO CAPS: 300.0000 mg | ORAL_CAPSULE | Freq: Two times a day (BID) | ORAL | 1 refills | Status: DC

## 2024-06-04 NOTE — Patient Instructions (Signed)
 Please call to schedule your mammogram and/or bone density: [facility] at [facility]  Address: [address], [place], [place] [address] Phone: [phone]  [facility] [address] [place],  [place]  [address] Phone: [phone]

## 2024-06-04 NOTE — Assessment & Plan Note (Signed)
 Chronic. Improved with Celebrex.  Would like to discuss narcotic pain medication.  New referral placed at visit today.  Patient declines injections.  Discussed risk taking Celebrex daily.  Increased Gabapentin  to 300mg  BID.  Follow up in 3 months.  Call sooner if concerns arise.

## 2024-06-04 NOTE — Assessment & Plan Note (Signed)
 Chronic. Well controlled.  No longer on any medications and feels like she is doing well.  Follow up in 3 months.  Call sooner if concerns arise.

## 2024-06-04 NOTE — Progress Notes (Signed)
 BP 121/76   Pulse 62   Temp 98.6 F (37 C) (Oral)   Ht 5' 8 (1.727 m)   Wt 218 lb 9.6 oz (99.2 kg)   SpO2 96%   BMI 33.24 kg/m    Subjective:    Patient ID: Madeline Zimmerman, female    DOB: 10/25/1960, 63 y.o.   MRN: [id number]  HPI: Madeline Zimmerman is a 63 y.o. female  Chief Complaint  Patient presents with   Anxiety   Depression   MOOD Patient states she is taking no longer taking the Duloxetine  and Wellbutrin.  She doesn't feel like she needs either medication. Does state that she has been having hot flashes and buzzing bees around her head.  The symptoms resolved.  Denies SI.   Flowsheet Row Telemedicine from 05/08/2024 in [facility]  PHQ-9 Total Score 5      05/08/2024   10:00 AM 04/10/2024   12:02 PM 03/08/2024   10:48 AM  GAD 7 : Generalized Anxiety Score  Nervous, Anxious, on Edge 0 0 0  Control/stop worrying 0 0 0  Worry too much - different things 0 0 0  Trouble relaxing 0 0 0  Restless 0 0 0  Easily annoyed or irritable 0 0 0  Afraid - awful might happen 0 0 0  Total GAD 7 Score 0 0 0  Anxiety Difficulty Not difficult at all Not difficult at all Not difficult at all   Patient states she is taking Celebrex.  She would like the prescription to be taken over here.  She does not want to have surgery or injections.  Does not feel like the injections will be helpful for her pain.  She does not feel like Tylenol  is not effective.  She does feel like the celebrex is working for her pain.    She is taking Gabapentin  300mg  nightly.  Would like to increase her Gabapentin  to 300mg  twice daily to help with her pain.     Relevant past medical, surgical, family and social history reviewed and updated as indicated. Interim medical history since our last visit reviewed. Allergies and medications reviewed and updated.  Review of Systems  Musculoskeletal:  Positive for back pain.  Neurological:        Buzzing bees  Psychiatric/Behavioral:  Positive for  dysphoric mood. The patient is nervous/anxious.     Per HPI unless specifically indicated above     Objective:    BP 121/76   Pulse 62   Temp 98.6 F (37 C) (Oral)   Ht 5' 8 (1.727 m)   Wt 218 lb 9.6 oz (99.2 kg)   SpO2 96%   BMI 33.24 kg/m   Wt Readings from Last 3 Encounters:  06/04/24 218 lb 9.6 oz (99.2 kg)  04/10/24 217 lb 3.2 oz (98.5 kg)  03/08/24 216 lb 3.2 oz (98.1 kg)    Physical Exam Vitals and nursing note reviewed.  Constitutional:      General: She is not in acute distress.    Appearance: Normal appearance. She is not ill-appearing, toxic-appearing or diaphoretic.  HENT:     Head: Normocephalic.     Right Ear: External ear normal.     Left Ear: External ear normal.     Nose: Nose normal.     Mouth/Throat:     Mouth: Mucous membranes are moist.     Pharynx: Oropharynx is clear.  Eyes:     General:  Right eye: No discharge.        Left eye: No discharge.     Extraocular Movements: Extraocular movements intact.     Conjunctiva/sclera: Conjunctivae normal.     Pupils: Pupils are equal, round, and reactive to light.  Cardiovascular:     Rate and Rhythm: Normal rate and regular rhythm.     Heart sounds: No murmur heard. Pulmonary:     Effort: Pulmonary effort is normal. No respiratory distress.     Breath sounds: Normal breath sounds. No wheezing or rales.  Musculoskeletal:     Cervical back: Normal range of motion and neck supple.  Skin:    General: Skin is warm and dry.     Capillary Refill: Capillary refill takes less than 2 seconds.  Neurological:     General: No focal deficit present.     Mental Status: She is alert and oriented to person, place, and time. Mental status is at baseline.  Psychiatric:        Mood and Affect: Mood normal.        Behavior: Behavior normal.        Thought Content: Thought content normal.        Judgment: Judgment normal.     Results for orders placed or performed in visit on 04/10/24  CBC with  Differential/Platelet   Collection Time: 04/10/24 12:14 PM  Result Value Ref Range   WBC 7.2 3.4 - 10.8 x10E3/uL   RBC 5.12 3.77 - 5.28 x10E6/uL   Hemoglobin 15.5 11.1 - 15.9 g/dL   Hematocrit 52.5 (H) 65.9 - 46.6 %   MCV 93 79 - 97 fL   MCH 30.3 26.6 - 33.0 pg   MCHC 32.7 31.5 - 35.7 g/dL   RDW 87.5 88.2 - 84.5 %   Platelets 352 150 - 450 x10E3/uL   Neutrophils 50 Not Estab. %   Lymphs 35 Not Estab. %   Monocytes 9 Not Estab. %   Eos 5 Not Estab. %   Basos 1 Not Estab. %   Neutrophils Absolute 3.6 1.4 - 7.0 x10E3/uL   Lymphocytes Absolute 2.5 0.7 - 3.1 x10E3/uL   Monocytes Absolute 0.7 0.1 - 0.9 x10E3/uL   EOS (ABSOLUTE) 0.4 0.0 - 0.4 x10E3/uL   Basophils Absolute 0.1 0.0 - 0.2 x10E3/uL   Immature Granulocytes 0 Not Estab. %   Immature Grans (Abs) 0.0 0.0 - 0.1 x10E3/uL  Comprehensive metabolic panel with GFR   Collection Time: 04/10/24 12:14 PM  Result Value Ref Range   Glucose 85 70 - 99 mg/dL   BUN 14 8 - 27 mg/dL   Creatinine, Ser 9.14 0.57 - 1.00 mg/dL   eGFR 76 >40 fO/fpw/8.26   BUN/Creatinine Ratio 16 12 - 28   Sodium 140 134 - 144 mmol/L   Potassium 4.7 3.5 - 5.2 mmol/L   Chloride 99 96 - 106 mmol/L   CO2 27 20 - 29 mmol/L   Calcium  10.0 8.7 - 10.3 mg/dL   Total Protein 6.6 6.0 - 8.5 g/dL   Albumin 4.0 3.9 - 4.9 g/dL   Globulin, Total 2.6 1.5 - 4.5 g/dL   Bilirubin Total 0.4 0.0 - 1.2 mg/dL   Alkaline Phosphatase 108 49 - 135 IU/L   AST 17 0 - 40 IU/L   ALT 12 0 - 32 IU/L  Lipid panel   Collection Time: 04/10/24 12:14 PM  Result Value Ref Range   Cholesterol, Total 197 100 - 199 mg/dL   Triglycerides 798 (H) 0 -  149 mg/dL   HDL 53 >60 mg/dL   VLDL Cholesterol Cal 35 5 - 40 mg/dL   LDL Chol Calc (NIH) 890 (H) 0 - 99 mg/dL   Chol/HDL Ratio 3.7 0.0 - 4.4 ratio  TSH   Collection Time: 04/10/24 12:14 PM  Result Value Ref Range   TSH 3.520 0.450 - 4.500 uIU/mL  Hepatitis C antibody   Collection Time: 04/10/24 12:14 PM  Result Value Ref Range   Hep C  Virus Ab Non Reactive Non Reactive  HIV Antibody (routine testing w rflx)   Collection Time: 04/10/24 12:14 PM  Result Value Ref Range   HIV Screen 4th Generation wRfx Non Reactive Non Reactive      Assessment & Plan:   Problem List Items Addressed This Visit       Other   Depressive disorder   Chronic. Well controlled.  No longer on any medications and feels like she is doing well.  Follow up in 3 months.  Call sooner if concerns arise.       Chronic midline low back pain without sciatica   Chronic. Improved with Celebrex.  Would like to discuss narcotic pain medication.  New referral placed at visit today.  Patient declines injections.  Discussed risk taking Celebrex daily.  Increased Gabapentin  to 300mg  BID.  Follow up in 3 months.  Call sooner if concerns arise.       Relevant Medications   CELEBREX 200 MG capsule   gabapentin  (NEURONTIN ) 300 MG capsule   Other Relevant Orders   Ambulatory referral to Pain Clinic   Other Visit Diagnoses       Screening for colon cancer    -  Primary   Relevant Orders   Ambulatory referral to Gastroenterology         Follow up plan: Return in about 3 months (around 09/04/2024) for Depression/Anxiety FU.

## 2024-06-27 ENCOUNTER — Ambulatory Visit (INDEPENDENT_AMBULATORY_CARE_PROVIDER_SITE_OTHER): Payer: No Typology Code available for payment source | Admitting: Podiatry

## 2024-06-27 ENCOUNTER — Ambulatory Visit

## 2024-06-27 VITALS — Ht 68.0 in | Wt 218.6 lb

## 2024-06-27 DIAGNOSIS — M79672 Pain in left foot: Secondary | ICD-10-CM

## 2024-06-27 DIAGNOSIS — Q66222 Congenital metatarsus adductus, left foot: Secondary | ICD-10-CM

## 2024-06-27 DIAGNOSIS — M19072 Primary osteoarthritis, left ankle and foot: Secondary | ICD-10-CM | POA: Diagnosis not present

## 2024-06-27 DIAGNOSIS — M2012 Hallux valgus (acquired), left foot: Secondary | ICD-10-CM | POA: Diagnosis not present

## 2024-06-27 DIAGNOSIS — M21612 Bunion of left foot: Secondary | ICD-10-CM

## 2024-06-27 NOTE — Patient Instructions (Signed)
°  VISIT SUMMARY: Today, we discussed your left foot pain, swelling, and deformity. You have been diagnosed with mild to moderate osteoarthritis, a bone spur, a ganglion cyst, hallux valgus, and metatarsus adductus in your left foot. We reviewed your symptoms and discussed various management options to help alleviate your discomfort and maintain your activity levels.  YOUR PLAN: -LEFT FOOT OSTEOARTHRITIS WITH BONE SPUR AND GANGLION CYST: Osteoarthritis is a condition where the cartilage in your joints wears down over time, causing pain and stiffness. A bone spur is a bony growth that can develop along the edges of bones, and a ganglion cyst is a noncancerous lump filled with jelly-like fluid. For your condition, we recommend reassessing your footwear to avoid tight or narrow shoes, using acetaminophen  and topical NSAIDs like Voltaren  or diclofenac  gel for pain relief, and taking hot water baths to reduce joint stiffness. Steroid injections and low dose radiation therapy were discussed as options for more severe pain, but they are not necessary at this stage. Continue staying active and call us  if your symptoms worsen.  -LEFT FOOT HALLUX VALGUS AND METATARSUS ADDUCTUS: Hallux valgus, commonly known as a bunion, is a deformity where the big toe deviates towards the other toes, and metatarsus adductus is a condition where the front part of the foot turns inward. These conditions are hereditary and can cause intermittent pain and discomfort. We recommend wearing comfortable, open footwear with adequate toe box space, such as ALTRA brand sneakers, to minimize pressure on the affected areas. Surgical correction is an option if symptoms become severe, but it is not needed at this time. Monitor your symptoms and contact us  if they worsen.  INSTRUCTIONS: Please follow the recommendations provided for managing your left foot conditions. If your symptoms worsen or become severe, contact our office for further  evaluation. Continue to stay active and use the suggested pain relief methods as needed.                      Contains text generated by [employer].                                 Contains text generated by [employer].

## 2024-06-30 NOTE — Progress Notes (Signed)
 "  Subjective:  Patient ID: Madeline Zimmerman, female    DOB: 10-11-1960,  MRN: [id number]  Chief Complaint  Patient presents with   Foot Pain    RM 4 Patient is here for left foot pain and swelling. Pt states pain is located in the dorsal aspect and bunion area of the left foot. Pain has been present for 6 months. Pt states no injury to foot. Pain is most intense after prolonged walking/standing.    Discussed the use of AI scribe software for clinical note transcription with the patient, who gave verbal consent to proceed.  History of Present Illness Madeline Zimmerman is a 63 year old female with left foot osteoarthritis, bone spur, ganglion cyst, hallux valgus, and metatarsus adductus who presents with left foot pain, swelling, and deformity.  She describes intermittent pain localized to the dorsal aspect of the left foot, occasionally associated with swelling and a dome-shaped prominence. The pain is exacerbated by wearing tight or narrow footwear but does not worsen with joint movement or direct palpation. She notes a palpable lump in the region, which she perceives as an abnormal growth.  She has noticed changes in the shape of her left foot over time. She avoids heels and prefers open, comfortable footwear due to discomfort with tight shoes. Pain and stiffness are most pronounced at initiation of activity but improve with continued movement. Symptomatic relief is achieved with hot water baths and intermittent use of topical diclofenac  gel. She remains active without significant functional limitation.      Objective:    Physical Exam VASCULAR: DP and PT pulse palpable. Foot is warm and well-perfused. Capillary fill time is brisk. DERMATOLOGIC: Normal skin turgor, texture, and temperature. No open lesions, rashes, or ulcerations. NEUROLOGIC: Normal sensation to light touch and pressure. No paresthesias. ORTHOPEDIC:  Mild prominent 1st metatarsal head, dorsal and medial. Good range of motion at 1st  MTPJ, painless on dorsal midfoot. Palpable area of spurring and small area of fluctuants consistent with bone spur and ganglion cyst.     No images are attached to the encounter.    Results L foot XR: Moderate arthritis of second and third, mild to moderate arthritis of third metatarsal joint, metatarsus adductus deformity on left foot. Bunion on left foot.    Assessment:   1. Metatarsus adductus of left foot   2. Hallux valgus with bunions, left   3. Arthritis of left foot      Plan:  Patient was evaluated and treated and all questions answered.  Assessment and Plan Assessment & Plan Left foot osteoarthritis with bone spur and ganglion cyst Mild to moderate osteoarthritis of the left second and third metatarsal joints with associated bone spur and small ganglion cyst. Symptoms are intermittent without significant functional limitation. Early, mild disease; conservative management is appropriate. - Recommended reassessment of footwear to avoid tight or narrow shoes that may compress nerves and exacerbate symptoms. - Advised use of acetaminophen  and topical NSAIDs (Voltaren  or diclofenac  gel) for symptomatic relief. - Discussed steroid injection as an option for more severe pain, noting it provides temporary relief but does not address underlying arthritis. - Reviewed low dose radiation therapy as a newer option for moderate arthritis unresponsive to conservative measures, with potential for up to two years of relief, but not indicated at this stage. - Advised use of hot water baths to reduce joint stiffness. - Encouraged continued activity and movement for joint health. - No need for immobilization or offloading at this time. -  Instructed to call if symptoms worsen or become severe. - Provided written summary of recommendations.  Left foot hallux valgus and metatarsus adductus Mild hallux valgus and metatarsus adductus deformity of the left foot, hereditary in nature, with  metatarsal head prominence and mild deviation. Symptoms are intermittent and mild, without significant disability. Surgical intervention not indicated at this time. - Recommended comfortable, open footwear with adequate toe box space to minimize pressure on the bunion and metatarsal area. - Suggested ALTRA brand sneakers for wide toe box and cushioning. - Discussed surgical correction as a long-term option if symptoms become severe, including risks and recovery time. - Reassured that current symptoms do not warrant surgical intervention. - Advised to monitor for worsening symptoms and to call if changes occur.      No follow-ups on file.   "

## 2024-07-24 ENCOUNTER — Other Ambulatory Visit: Payer: Self-pay | Admitting: Internal Medicine

## 2024-07-24 DIAGNOSIS — I10 Essential (primary) hypertension: Secondary | ICD-10-CM

## 2024-08-01 ENCOUNTER — Encounter: Payer: Self-pay | Admitting: Nurse Practitioner

## 2024-08-01 MED ORDER — CELEBREX 200 MG PO CAPS: 200.0000 mg | ORAL_CAPSULE | Freq: Every day | ORAL | 3 refills | Status: AC

## 2024-08-10 ENCOUNTER — Encounter: Payer: Self-pay | Admitting: Nurse Practitioner

## 2024-08-14 ENCOUNTER — Encounter: Payer: Self-pay | Admitting: Nurse Practitioner

## 2024-08-14 ENCOUNTER — Telehealth: Admitting: Nurse Practitioner

## 2024-08-14 DIAGNOSIS — Z1211 Encounter for screening for malignant neoplasm of colon: Secondary | ICD-10-CM

## 2024-08-14 DIAGNOSIS — F32A Depression, unspecified: Secondary | ICD-10-CM

## 2024-08-14 MED ORDER — GABAPENTIN 300 MG PO CAPS: 300.0000 mg | ORAL_CAPSULE | Freq: Four times a day (QID) | ORAL | 1 refills | Status: AC

## 2024-08-14 MED ORDER — CITALOPRAM HYDROBROMIDE 20 MG PO TABS: 20.0000 mg | ORAL_TABLET | Freq: Every day | ORAL | 0 refills | Status: AC

## 2024-08-14 NOTE — Assessment & Plan Note (Signed)
 Chronic.  Not well controlled.  Will start Citalopram 20mg  daily.  Side effects and benefits of medication discussed.  Follow up in 2 weeks.  Call sooner if concerns arise.

## 2024-09-04 ENCOUNTER — Ambulatory Visit: Admitting: Nurse Practitioner
